# Patient Record
Sex: Female | Born: 1950
Health system: Southern US, Community
[De-identification: ages and names within clinical notes are randomized; demographics above are authoritative.]

## PROBLEM LIST (undated history)

## (undated) DIAGNOSIS — IMO0002 Reserved for concepts with insufficient information to code with codable children: Secondary | ICD-10-CM

## (undated) DIAGNOSIS — Z5189 Encounter for other specified aftercare: Secondary | ICD-10-CM

## (undated) DIAGNOSIS — M8580 Other specified disorders of bone density and structure, unspecified site: Secondary | ICD-10-CM

## (undated) DIAGNOSIS — N814 Uterovaginal prolapse, unspecified: Secondary | ICD-10-CM

## (undated) DIAGNOSIS — N393 Stress incontinence (female) (male): Secondary | ICD-10-CM

## (undated) DIAGNOSIS — R011 Cardiac murmur, unspecified: Secondary | ICD-10-CM

## (undated) DIAGNOSIS — I10 Essential (primary) hypertension: Secondary | ICD-10-CM

## (undated) DIAGNOSIS — N816 Rectocele: Secondary | ICD-10-CM

## (undated) DIAGNOSIS — H269 Unspecified cataract: Secondary | ICD-10-CM

## (undated) DIAGNOSIS — N189 Chronic kidney disease, unspecified: Secondary | ICD-10-CM

## (undated) HISTORY — DX: Chronic kidney disease, unspecified: N18.9

## (undated) HISTORY — DX: Other specified disorders of bone density and structure, unspecified site: M85.80

## (undated) HISTORY — DX: Cardiac murmur, unspecified: R01.1

## (undated) HISTORY — DX: Uterovaginal prolapse, unspecified: N81.4

## (undated) HISTORY — DX: Unspecified cataract: H26.9

## (undated) HISTORY — DX: Reserved for concepts with insufficient information to code with codable children: IMO0002

## (undated) HISTORY — DX: Essential (primary) hypertension: I10

## (undated) HISTORY — DX: Stress incontinence (female) (male): N39.3

## (undated) HISTORY — DX: Encounter for other specified aftercare: Z51.89

## (undated) HISTORY — DX: Rectocele: N81.6

---

## 1991-04-24 HISTORY — PX: VAGINAL HYSTERECTOMY: SUR661

## 1998-05-05 ENCOUNTER — Other Ambulatory Visit: Admission: RE | Admit: 1998-05-05 | Discharge: 1998-05-05 | Payer: Self-pay | Admitting: Obstetrics and Gynecology

## 1999-06-09 ENCOUNTER — Other Ambulatory Visit: Admission: RE | Admit: 1999-06-09 | Discharge: 1999-06-09 | Payer: Self-pay | Admitting: Obstetrics and Gynecology

## 2000-06-18 ENCOUNTER — Other Ambulatory Visit: Admission: RE | Admit: 2000-06-18 | Discharge: 2000-06-18 | Payer: Self-pay | Admitting: Obstetrics and Gynecology

## 2001-11-04 ENCOUNTER — Other Ambulatory Visit: Admission: RE | Admit: 2001-11-04 | Discharge: 2001-11-04 | Payer: Self-pay | Admitting: Obstetrics and Gynecology

## 2003-11-05 ENCOUNTER — Other Ambulatory Visit: Admission: RE | Admit: 2003-11-05 | Discharge: 2003-11-05 | Payer: Self-pay | Admitting: Obstetrics and Gynecology

## 2004-11-24 ENCOUNTER — Other Ambulatory Visit: Admission: RE | Admit: 2004-11-24 | Discharge: 2004-11-24 | Payer: Self-pay | Admitting: Obstetrics and Gynecology

## 2005-03-07 ENCOUNTER — Ambulatory Visit: Payer: Self-pay | Admitting: Internal Medicine

## 2005-11-27 ENCOUNTER — Other Ambulatory Visit: Admission: RE | Admit: 2005-11-27 | Discharge: 2005-11-27 | Payer: Self-pay | Admitting: Obstetrics and Gynecology

## 2006-05-03 ENCOUNTER — Ambulatory Visit: Payer: Self-pay | Admitting: Internal Medicine

## 2006-10-11 ENCOUNTER — Telehealth (INDEPENDENT_AMBULATORY_CARE_PROVIDER_SITE_OTHER): Payer: Self-pay | Admitting: *Deleted

## 2006-12-05 ENCOUNTER — Encounter: Payer: Self-pay | Admitting: Internal Medicine

## 2006-12-05 ENCOUNTER — Other Ambulatory Visit: Admission: RE | Admit: 2006-12-05 | Discharge: 2006-12-05 | Payer: Self-pay | Admitting: Obstetrics and Gynecology

## 2007-04-24 HISTORY — PX: COLONOSCOPY: SHX174

## 2007-06-20 ENCOUNTER — Telehealth (INDEPENDENT_AMBULATORY_CARE_PROVIDER_SITE_OTHER): Payer: Self-pay | Admitting: *Deleted

## 2007-07-28 ENCOUNTER — Telehealth (INDEPENDENT_AMBULATORY_CARE_PROVIDER_SITE_OTHER): Payer: Self-pay | Admitting: *Deleted

## 2007-10-31 ENCOUNTER — Ambulatory Visit: Payer: Self-pay | Admitting: Internal Medicine

## 2007-11-10 ENCOUNTER — Ambulatory Visit: Payer: Self-pay | Admitting: Internal Medicine

## 2007-11-10 ENCOUNTER — Encounter (INDEPENDENT_AMBULATORY_CARE_PROVIDER_SITE_OTHER): Payer: Self-pay | Admitting: *Deleted

## 2007-11-10 DIAGNOSIS — I10 Essential (primary) hypertension: Secondary | ICD-10-CM | POA: Insufficient documentation

## 2007-11-10 DIAGNOSIS — R002 Palpitations: Secondary | ICD-10-CM | POA: Insufficient documentation

## 2007-11-10 LAB — CONVERTED CEMR LAB
ALT: 16 units/L (ref 0–35)
AST: 19 units/L (ref 0–37)
Alkaline Phosphatase: 68 units/L (ref 39–117)
BUN: 21 mg/dL (ref 6–23)
Basophils Absolute: 0 10*3/uL (ref 0.0–0.1)
Basophils Relative: 0.3 % (ref 0.0–1.0)
CO2: 32 meq/L (ref 19–32)
Chloride: 103 meq/L (ref 96–112)
Creatinine, Ser: 1.2 mg/dL (ref 0.4–1.2)
Eosinophils Relative: 3.6 % (ref 0.0–5.0)
LDL Cholesterol: 114 mg/dL — ABNORMAL HIGH (ref 0–99)
Lymphocytes Relative: 42.3 % (ref 12.0–46.0)
Neutrophils Relative %: 46.7 % (ref 43.0–77.0)
Platelets: 228 10*3/uL (ref 150–400)
Potassium: 4.6 meq/L (ref 3.5–5.1)
RBC: 4.02 M/uL (ref 3.87–5.11)
TSH: 2.18 microintl units/mL (ref 0.35–5.50)
Total Bilirubin: 0.7 mg/dL (ref 0.3–1.2)
VLDL: 18 mg/dL (ref 0–40)
WBC: 5.4 10*3/uL (ref 4.5–10.5)

## 2007-11-11 ENCOUNTER — Encounter (INDEPENDENT_AMBULATORY_CARE_PROVIDER_SITE_OTHER): Payer: Self-pay | Admitting: *Deleted

## 2007-11-13 ENCOUNTER — Telehealth (INDEPENDENT_AMBULATORY_CARE_PROVIDER_SITE_OTHER): Payer: Self-pay | Admitting: *Deleted

## 2007-12-01 ENCOUNTER — Encounter: Payer: Self-pay | Admitting: Internal Medicine

## 2007-12-15 ENCOUNTER — Other Ambulatory Visit: Admission: RE | Admit: 2007-12-15 | Discharge: 2007-12-15 | Payer: Self-pay | Admitting: Obstetrics and Gynecology

## 2008-01-05 ENCOUNTER — Ambulatory Visit: Payer: Self-pay | Admitting: Obstetrics and Gynecology

## 2008-05-10 ENCOUNTER — Telehealth (INDEPENDENT_AMBULATORY_CARE_PROVIDER_SITE_OTHER): Payer: Self-pay | Admitting: *Deleted

## 2008-12-29 ENCOUNTER — Other Ambulatory Visit: Admission: RE | Admit: 2008-12-29 | Discharge: 2008-12-29 | Payer: Self-pay | Admitting: Obstetrics and Gynecology

## 2008-12-29 ENCOUNTER — Encounter: Payer: Self-pay | Admitting: Obstetrics and Gynecology

## 2008-12-29 ENCOUNTER — Ambulatory Visit: Payer: Self-pay | Admitting: Obstetrics and Gynecology

## 2009-03-22 ENCOUNTER — Telehealth (INDEPENDENT_AMBULATORY_CARE_PROVIDER_SITE_OTHER): Payer: Self-pay | Admitting: *Deleted

## 2009-03-29 ENCOUNTER — Encounter (INDEPENDENT_AMBULATORY_CARE_PROVIDER_SITE_OTHER): Payer: Self-pay | Admitting: *Deleted

## 2009-05-24 ENCOUNTER — Ambulatory Visit: Payer: Self-pay | Admitting: Internal Medicine

## 2009-05-24 DIAGNOSIS — K219 Gastro-esophageal reflux disease without esophagitis: Secondary | ICD-10-CM

## 2009-06-28 ENCOUNTER — Telehealth (INDEPENDENT_AMBULATORY_CARE_PROVIDER_SITE_OTHER): Payer: Self-pay | Admitting: *Deleted

## 2009-11-07 ENCOUNTER — Ambulatory Visit: Payer: Self-pay | Admitting: Internal Medicine

## 2009-11-07 DIAGNOSIS — R198 Other specified symptoms and signs involving the digestive system and abdomen: Secondary | ICD-10-CM

## 2009-11-18 ENCOUNTER — Telehealth (INDEPENDENT_AMBULATORY_CARE_PROVIDER_SITE_OTHER): Payer: Self-pay | Admitting: *Deleted

## 2009-12-05 ENCOUNTER — Telehealth (INDEPENDENT_AMBULATORY_CARE_PROVIDER_SITE_OTHER): Payer: Self-pay | Admitting: *Deleted

## 2010-01-05 ENCOUNTER — Ambulatory Visit: Payer: Self-pay | Admitting: Obstetrics and Gynecology

## 2010-01-05 ENCOUNTER — Other Ambulatory Visit: Admission: RE | Admit: 2010-01-05 | Discharge: 2010-01-05 | Payer: Self-pay | Admitting: Obstetrics and Gynecology

## 2010-01-10 ENCOUNTER — Telehealth: Payer: Self-pay | Admitting: Internal Medicine

## 2010-05-12 ENCOUNTER — Encounter: Payer: Self-pay | Admitting: Internal Medicine

## 2010-05-23 NOTE — Progress Notes (Signed)
Summary: Referral Order  Phone Note Call from Patient   Caller: Patient Summary of Call: Needs referral to [facility] Dr. Kinnie Scales.  Pt spoke with Bjorn Loser  at Dr. Aurora Mask office and their fax [phone] pt can be contacted at [phone] Initial call taken by: Lavell Islam,  December 05, 2009 11:51 AM  Follow-up for Phone Call        Order placed, Renee (referral Coor will complete) Follow-up by: Shonna Chock CMA,  December 05, 2009 12:01 PM

## 2010-05-23 NOTE — Progress Notes (Signed)
Summary: cymbalta refill   Phone Note Refill Request Call back at Home Phone [phone] Message from:  Patient on June 28, 2009 5:01 PM  was given samples of cymbalta at office visit on 05/2009 was told to call back for prescription   Initial call taken by: Doristine Devoid,  June 28, 2009 5:01 PM    New/Updated Medications: CYMBALTA 30 MG CPEP (DULOXETINE HCL) take one tablet daily Prescriptions: CYMBALTA 30 MG CPEP (DULOXETINE HCL) take one tablet daily  #30 x 2   Entered by:   Doristine Devoid   Authorized by:   Marga Melnick MD   Signed by:   Doristine Devoid on 06/28/2009   Method used:   Electronically to        [facility]. [phone]* (retail)       [address]       [place], [place]  [address]       Ph: [phone]       Fax: [phone]   RxID:   [id number]

## 2010-05-23 NOTE — Progress Notes (Signed)
Summary: FYI  Phone Note From Other Clinic   Caller: Nurse Details for Reason: FYI Summary of Call: Call from [facility] @ Dr. Lady Saucier office, they called to advised that pt cancelled appt for tomorrow. Initial call taken by: Almeta Monas CMA Duncan Dull),  January 10, 2010 8:27 AM  Follow-up for Phone Call        noted Follow-up by: Marga Melnick MD,  January 10, 2010 3:00 PM

## 2010-05-23 NOTE — Assessment & Plan Note (Signed)
Summary: cpx/ns/kdc   Vital Signs:  Patient profile:   60 year old female Height:      64.25 inches Weight:      148.2 pounds BMI:     25.33 Temp:     98.4 degrees F oral Resp:     14 per minute BP sitting:   100 / 66  (left arm) Cuff size:   regular  Vitals Entered By: Shonna Chock (May 24, 2009 3:48 PM)  Comments REVIEWED MED LIST, PATIENT AGREED DOSE AND INSTRUCTION CORRECT    History of Present Illness: Danielle Fischer is here for a physical ; she states she felt better with Citalopram , ie less irritated, but she gained 15#.  Preventive Screening-Counseling & Management  Alcohol-Tobacco     Smoking Status: quit  Caffeine-Diet-Exercise     Does Patient Exercise: yes  Allergies: 1)  ! Erythromycin  Past History:  Past Medical History: Murmur ,aortic valve ; Fibromyalgia  symptoms, controlled with Amitriptyline ; Hypertension ; Palpitations GERD, PMH of  Past Surgical History: G 3 P 3 ; Endoscopy  1994: esophagitis, gastritis; Colonoscopy 2009 negative; Hysterectomy w/o BSO for prolapse  Family History: Father: MI @ 54 (3 ppd smoker), ALS d @ 61; Mother: HTN; Siblings: bro HTN ; son(Sam) Bipolar  Social History: no diet Occupation:Teacher Married Former Smoker: quit age 41 Alcohol use-yes: socially Regular exercise-yes : gym  Review of Systems General:  Denies fatigue, sleep disorder, and weight loss; Weight stable. Eyes:  Denies blurring, double vision, and vision loss-both eyes. ENT:  Denies difficulty swallowing and hoarseness; Stable decreased hearing . CV:  Denies chest pain or discomfort, leg cramps with exertion, lightheadness, near fainting, palpitations, shortness of breath with exertion, swelling of feet, and swelling of hands. Resp:  Denies cough, excessive snoring, hypersomnolence, morning headaches, shortness of breath, and sputum productive. GI:  Denies abdominal pain, bloody stools, change in bowel habits, dark tarry stools, and  indigestion. GU:  Denies discharge, dysuria, and hematuria. MS:  Complains of joint pain; denies joint redness, joint swelling, low back pain, mid back pain, muscle weakness, and thoracic pain; Some knee pain, no Rx. Derm:  Denies changes in nail beds, dryness, hair loss, lesion(s), and rash. Neuro:  Denies numbness and tingling. Psych:  Complains of irritability; denies anxiety, depression, easily angered, and easily tearful; Some hyperfocusing off SSRI. Endo:  Denies cold intolerance, excessive hunger, excessive thirst, excessive urination, and heat intolerance. Heme:  Denies abnormal bruising and bleeding. Allergy:  Denies itching eyes and sneezing.  Physical Exam  General:  well-nourished,in no acute distress; alert,appropriate and cooperative throughout examination Head:  Normocephalic and atraumatic without obvious abnormalities.  Eyes:  No corneal or conjunctival inflammation noted. Perrla. Funduscopic exam benign, without hemorrhages, exudates or papilledema.  Ears:  External ear exam shows no significant lesions or deformities.  Otoscopic examination reveals clear canals, tympanic membranes are intact bilaterally without bulging, retraction, inflammation or discharge. Hearing is grossly normal bilaterally. Nose:  External nasal examination shows no deformity or inflammation. Nasal mucosa are pink and moist without lesions or exudates. Mouth:  Oral mucosa and oropharynx without lesions or exudates.  Teeth in good repair. Neck:  No deformities, masses, or tenderness noted. Lungs:  Normal respiratory effort, chest expands symmetrically. Lungs are clear to auscultation, no crackles or wheezes. Heart:  Normal rate and regular rhythm. S1 and S2 normal without gallop, murmur,  rub . Click suggested @ LSB Abdomen:  Bowel sounds positive,abdomen soft and non-tender without masses, organomegaly or  hernias noted. Genitalia:  Dr Eda Paschal Msk:  No deformity or scoliosis noted of thoracic or  lumbar spine.   Pulses:  R and L carotid,radial,dorsalis pedis and posterior tibial pulses are full and equal bilaterally Extremities:  No clubbing, cyanosis, edema, or deformity noted with normal full range of motion of all joints.   Minor crepitus of knees Neurologic:  alert & oriented X3 and DTRs symmetrical and normal.   Skin:  Intact without suspicious lesions or rashes Cervical Nodes:  No lymphadenopathy noted Axillary Nodes:  No palpable lymphadenopathy Psych:  memory intact for recent and remote, normally interactive, good eye contact, and not anxious appearing.     Impression & Recommendations:  Problem # 1:  PREVENTIVE HEALTH CARE (ICD-V70.0)  Orders: EKG w/ Interpretation (93000)  Problem # 2:  HYPERTENSION (ICD-401.9)  Her updated medication list for this problem includes:    Metoprolol Succinate 100 Mg Xr24h-tab (Metoprolol succinate) .Marland Kitchen... 1/2 by mouth once daily  Orders: EKG w/ Interpretation (93000)  Complete Medication List: 1)  Amitriptyline Hcl 25 Mg Tabs (Amitriptyline hcl) .... Take one tablet at night as needed. 2)  Metoprolol Succinate 100 Mg Xr24h-tab (Metoprolol succinate) .... 1/2 by mouth once daily  Patient Instructions: 1)  Consider fasting labs: 2)  BMP ; 3)  Hepatic Panel ; 4)  Lipid Panel ; 5)  TSH ; 6)  CBC w/ Diff . Consider trial of low dose Cymbalta 30 mg (Note:#28 samples provided) Prescriptions: METOPROLOL SUCCINATE 100 MG XR24H-TAB (METOPROLOL SUCCINATE) 1/2 by mouth once daily  #90 x 1   Entered and Authorized by:   Marga Melnick MD   Signed by:   Marga Melnick MD on 05/24/2009   Method used:   Print then Give to Patient   RxID:   [id number] AMITRIPTYLINE HCL 25 MG TABS (AMITRIPTYLINE HCL) Take one tablet at night as needed.  #90 x 3   Entered and Authorized by:   Marga Melnick MD   Signed by:   Marga Melnick MD on 05/24/2009   Method used:   Print then Give to Patient   RxID:   [id number]

## 2010-05-23 NOTE — Assessment & Plan Note (Signed)
Summary: diarhhea/kn   Vital Signs:  Patient profile:   60 year old female Weight:      149.2 pounds BMI:     25.50 Temp:     98.9 degrees F oral Pulse rate:   64 / minute Resp:     15 per minute BP sitting:   118 / 72  (left arm) Cuff size:   large  Vitals Entered By: Shonna Chock CMA (November 07, 2009 11:59 AM) CC: Diarrhea for too long   CC:  Diarrhea for too long.  History of Present Illness: Stool has been mainly loose for weeks,occasionally( every 4 days) frank diarrhea.Stress is trigger ; this  has been recurrent pattern. Recent family stresses. Rx: Prilosec OTC X 4 days   & Align w/o benefit.PMH of gastritis  & esophagitis in 1994.  Allergies: 1)  ! Erythromycin  Review of Systems General:  Denies chills, fever, sweats, and weight loss. GI:  Complains of gas; denies abdominal pain, bloody stools, dark tarry stools, excessive appetite, indigestion, loss of appetite, nausea, and vomiting.  Physical Exam  General:  well-nourished,in no acute distress; alert,appropriate and cooperative throughout examination Eyes:  No corneal or conjunctival inflammation noted.No icterus Mouth:  Oral mucosa and oropharynx without lesions or exudates.  Teeth in good repair. No pharyngeal erythema.   Heart:  Normal rate and regular rhythm. S1 and S2 normal without gallop, murmur, click, rub .S4 Abdomen:  Bowel sounds positive,abdomen soft and non-tender without masses, organomegaly or hernias noted. Aorta palpable Skin:  Intact without suspicious lesions or rashes. No jaundice Cervical Nodes:  No lymphadenopathy noted Axillary Nodes:  No palpable lymphadenopathy Psych:  memory intact for recent and remote, normally interactive, and good eye contact.     Impression & Recommendations:  Problem # 1:  CHANGE IN BOWELS (JXB-147.82)  Problem # 2:  GERD (ICD-530.81)  Her updated medication list for this problem includes:    Hyoscyamine Sulfate 0.125 Mg Subl (Hyoscyamine sulfate) .Marland Kitchen... 1  sublingually every 6-8 hrs as needed  Complete Medication List: 1)  Amitriptyline Hcl 25 Mg Tabs (Amitriptyline hcl) .... Take one tablet at night as needed. 2)  Metoprolol Succinate 100 Mg Xr24h-tab (Metoprolol succinate) .... 1/2 by mouth once daily 3)  Cymbalta 30 Mg Cpep (Duloxetine hcl) .... Take one tablet daily 4)  Hyoscyamine Sulfate 0.125 Mg Subl (Hyoscyamine sulfate) .Marland Kitchen.. 1 sublingually every 6-8 hrs as needed  Patient Instructions: 1)  Avoid foods high in acid (tomatoes, citrus juices, spicy foods). Avoid eating within two hours of lying down or before exercising. Do not over eat; try smaller more frequent meals. Elevate head of bed twelve inches when sleeping. Trial of Prilosec OTC  two times a day pre meals  Prescriptions: HYOSCYAMINE SULFATE 0.125 MG SUBL (HYOSCYAMINE SULFATE) 1 sublingually every 6-8 hrs as needed  #30 x 0   Entered and Authorized by:   Marga Melnick MD   Signed by:   Marga Melnick MD on 11/07/2009   Method used:   Print then Give to Patient   RxID:   [id number]

## 2010-05-23 NOTE — Progress Notes (Signed)
Summary: diarrhea no better  Phone Note Outgoing Call Call back at [facility] Phone [phone]   Call placed by: [facility] CMA,  November 18, 2009 10:52 AM Summary of Call: pt states  she was seen last week for diarrhea. pt states that she has tried all the med dr hopper gave but still has no relief. Pt would like to know what to do now. pls advise..................Marland KitchenFelecia Deloach CMA  November 18, 2009 10:55 AM   Follow-up for Phone Call        Generic Lomotil 2.5 mg as needed for frank watery BMs #10. GI consult if no better. Keep Diary of possible food triggers especially wheat & dairy Follow-up by: Marga Melnick MD,  November 18, 2009 1:47 PM    Additional Follow-up for Phone Call Additional follow up Details #2:: Follow-up by: Marga Melnick MD,  November 18, 2009 4:55 PM  Additional Follow-up for Phone Call Additional follow up Details #3:: Details for Additional Follow-up Action Taken: Left message on machine with Dr.Hopper's instruction and informing patient rx sent to pharmacy on file. Patient to call on Monday if any questions or concerns./Chrae [facility] CMA  November 18, 2009 5:03 PM   New/Updated Medications: * LOMOTIL 2.5 MG as needed for frank watery BMs Prescriptions: LOMOTIL 2.5 MG as needed for frank watery BMs  #10 x 0   Entered by:   Shonna Chock CMA   Authorized by:   Marga Melnick MD   Signed by:   Shonna Chock CMA on 11/18/2009   Method used:   Faxed to ...       [facility] [address] [phone]* (retail)       [address]       [place], [place]  [address]       Ph: [phone]       Fax: [phone]   RxID:   [id number]

## 2010-07-11 ENCOUNTER — Other Ambulatory Visit: Payer: Self-pay | Admitting: Internal Medicine

## 2010-09-13 ENCOUNTER — Other Ambulatory Visit: Payer: Self-pay | Admitting: Internal Medicine

## 2010-09-13 NOTE — Telephone Encounter (Signed)
Patient needs to schedule  CPX  

## 2010-12-16 ENCOUNTER — Other Ambulatory Visit: Payer: Self-pay | Admitting: Internal Medicine

## 2010-12-18 NOTE — Telephone Encounter (Signed)
Patient needs to schedule a CPX  

## 2011-01-05 ENCOUNTER — Encounter: Payer: Self-pay | Admitting: Gynecology

## 2011-01-05 DIAGNOSIS — N814 Uterovaginal prolapse, unspecified: Secondary | ICD-10-CM | POA: Insufficient documentation

## 2011-01-05 DIAGNOSIS — M8580 Other specified disorders of bone density and structure, unspecified site: Secondary | ICD-10-CM | POA: Insufficient documentation

## 2011-01-05 DIAGNOSIS — IMO0002 Reserved for concepts with insufficient information to code with codable children: Secondary | ICD-10-CM | POA: Insufficient documentation

## 2011-01-05 DIAGNOSIS — N393 Stress incontinence (female) (male): Secondary | ICD-10-CM | POA: Insufficient documentation

## 2011-01-05 DIAGNOSIS — N816 Rectocele: Secondary | ICD-10-CM | POA: Insufficient documentation

## 2011-01-11 ENCOUNTER — Other Ambulatory Visit: Payer: Self-pay | Admitting: Internal Medicine

## 2011-01-12 NOTE — Telephone Encounter (Signed)
Last seen 10/2009 I called patient and left message on her voicemail informing her she needs to call and schedule a CPX   Dr.Hopper please respond on 90day supply request for rx

## 2011-01-18 ENCOUNTER — Encounter (INDEPENDENT_AMBULATORY_CARE_PROVIDER_SITE_OTHER): Payer: Self-pay | Admitting: Obstetrics and Gynecology

## 2011-01-18 DIAGNOSIS — Z01419 Encounter for gynecological examination (general) (routine) without abnormal findings: Secondary | ICD-10-CM

## 2011-01-18 NOTE — Progress Notes (Signed)
Pt. Was accidentally arrived in the computer but she was actually a no show

## 2011-01-19 NOTE — Progress Notes (Signed)
Patient never came for appoinment. Our office will calll her.

## 2011-01-22 ENCOUNTER — Other Ambulatory Visit: Payer: Self-pay | Admitting: Internal Medicine

## 2011-02-21 NOTE — Telephone Encounter (Signed)
Done

## 2011-02-21 NOTE — Telephone Encounter (Signed)
OK X1 

## 2011-04-11 ENCOUNTER — Telehealth: Payer: Self-pay | Admitting: Internal Medicine

## 2011-04-11 NOTE — Telephone Encounter (Signed)
Patient had cpx scheduled 04-13-11 but she is cancelling & wants to just have a late appt for bp check - her last cpx was 05-24-09 - i offered an appt for cpx 8:30 & as late as 2:30 she said she needs later appt - is it ok to schedule bp check

## 2011-04-12 NOTE — Telephone Encounter (Signed)
Yes this needs to be an appointment with the doctor (not a nurse visit)

## 2011-04-12 NOTE — Telephone Encounter (Signed)
lmom for patient to schedule bp check with md

## 2011-04-13 ENCOUNTER — Encounter: Payer: Self-pay | Admitting: Internal Medicine

## 2011-04-19 NOTE — Telephone Encounter (Signed)
Patient returned call - appt scheduled [phone]

## 2011-04-25 ENCOUNTER — Encounter: Payer: Self-pay | Admitting: Internal Medicine

## 2011-04-25 ENCOUNTER — Ambulatory Visit (INDEPENDENT_AMBULATORY_CARE_PROVIDER_SITE_OTHER): Payer: BC Managed Care – PPO | Admitting: Internal Medicine

## 2011-04-25 DIAGNOSIS — E785 Hyperlipidemia, unspecified: Secondary | ICD-10-CM

## 2011-04-25 DIAGNOSIS — Z8249 Family history of ischemic heart disease and other diseases of the circulatory system: Secondary | ICD-10-CM

## 2011-04-25 DIAGNOSIS — M8580 Other specified disorders of bone density and structure, unspecified site: Secondary | ICD-10-CM

## 2011-04-25 DIAGNOSIS — I10 Essential (primary) hypertension: Secondary | ICD-10-CM

## 2011-04-25 DIAGNOSIS — M899 Disorder of bone, unspecified: Secondary | ICD-10-CM

## 2011-04-25 DIAGNOSIS — K219 Gastro-esophageal reflux disease without esophagitis: Secondary | ICD-10-CM

## 2011-04-25 LAB — CBC WITH DIFFERENTIAL/PLATELET
Basophils Relative: 0.7 % (ref 0.0–3.0)
Eosinophils Relative: 2.6 % (ref 0.0–5.0)
HCT: 39 % (ref 36.0–46.0)
Lymphs Abs: 1.7 10*3/uL (ref 0.7–4.0)
MCV: 94.8 fl (ref 78.0–100.0)
Monocytes Absolute: 0.4 10*3/uL (ref 0.1–1.0)
Monocytes Relative: 8.4 % (ref 3.0–12.0)
RBC: 4.12 Mil/uL (ref 3.87–5.11)
WBC: 4.2 10*3/uL — ABNORMAL LOW (ref 4.5–10.5)

## 2011-04-25 LAB — BASIC METABOLIC PANEL
BUN: 19 mg/dL (ref 6–23)
CO2: 31 mEq/L (ref 19–32)
Calcium: 9.6 mg/dL (ref 8.4–10.5)
Chloride: 106 mEq/L (ref 96–112)
Creatinine, Ser: 1.2 mg/dL (ref 0.4–1.2)
Glucose, Bld: 112 mg/dL — ABNORMAL HIGH (ref 70–99)

## 2011-04-25 LAB — LIPID PANEL
Cholesterol: 217 mg/dL — ABNORMAL HIGH (ref 0–200)
HDL: 79.5 mg/dL (ref >39.00)
Triglycerides: 143 mg/dL (ref 0.0–149.0)

## 2011-04-25 LAB — HEPATIC FUNCTION PANEL
ALT: 16 U/L (ref 0–35)
Albumin: 4.2 g/dL (ref 3.5–5.2)
Total Protein: 7.3 g/dL (ref 6.0–8.3)

## 2011-04-25 LAB — TSH: TSH: 1.67 u[IU]/mL (ref 0.35–5.50)

## 2011-04-25 NOTE — Patient Instructions (Addendum)
Blood Pressure Goal  Ideally is an AVERAGE < 135/85. This AVERAGE should be calculated from @ least 5-7 BP readings taken @ different times of day on different days of week. You should not respond to isolated BP readings , but rather the AVERAGE for that week  Preventive Health Care: Exercise  30-45  minutes a day, 3-4 days a week. Walking is especially valuable in preventing Osteoporosis. Eat a low-fat diet with lots of fruits and vegetables, up to 7-9 servings per day. Consume less than 30 grams of sugar per day from foods & drinks with High Fructose Corn Syrup as #1, 2,3 or #4 on label. Health Care Power of Attorney & Living Will place you in charge of your health care  decisions. Verify these are  in place. Please complete stool cards   As per the Standard of Care , screening Colonoscopy recommended @ 50 & every 5-10 years thereafter . More frequent monitor would be dictated by family history or findings @ Colonoscopy

## 2011-04-25 NOTE — Progress Notes (Signed)
Addended byPecola Lawless on: 04/25/2011 09:02 AM   Modules accepted: Orders

## 2011-04-25 NOTE — Progress Notes (Signed)
  Subjective:    Patient ID: Danielle Fischer, female    DOB: 1950-06-05, 61 y.o.   MRN: [id number]  HPI CHRONIC HYPERTENSION: Disease Monitoring  Blood pressure range: not monitored  Chest pain: no   Dyspnea: no   Claudication: no               Palpitations : no Medication compliance: yes, off it by mistake for 24 hours when out of town over holidays Medication Side Effects  Lightheadedness: no   Urinary frequency: no   Edema: no   Preventitive Healthcare:  Exercise: no   Diet Pattern: no  Salt Restriction: no     Review of Systems She has no active symptoms. Her gynecologist has been performing lab studies and monitoring her osteopenia. She has not had a colonoscopy for 10 years. Specifically she denies any abdominal pain, significant weight loss, rectal bleeding, or melena.        Objective:   Physical Exam Gen.: Healthy and well-nourished in appearance. Alert, appropriate and cooperative throughout exam. Eyes: No corneal or conjunctival inflammation noted. Pupils equal round reactive to light and accommodation. Fundal exam is benign without hemorrhages, exudate, papilledema. Neck: No deformities, masses, or tenderness noted.  Thyroid normal. Lungs: Normal respiratory effort; chest expands symmetrically. Lungs are clear to auscultation without rales, wheezes, or increased work of breathing. Heart: Normal rate and rhythm. Normal S1 and S2. No gallop, click, or rub. Grade 1/2  Systolic  murmur. Abdomen: Bowel sounds normal; abdomen soft and nontender. No masses, organomegaly or hernias noted. The aorta is palpable; there is no enlargement or aneurysm. Genitalia: Dr Eda Paschal   .                                                                                   Musculoskeletal/extremities:  No clubbing, cyanosis, edema, or deformity noted. Nail health  good. Vascular: Carotid, radial artery, dorsalis pedis and  posterior tibial pulses are full and equal. No bruits  present. Neurologic: Alert and oriented x3. Deep tendon reflexes symmetrical and normal.          Skin: Intact without suspicious lesions or rashes. Lymph: No cervical, axillary lymphadenopathy present. Psych: Mood and affect are normal. Normally interactive                                                                                         Assessment & Plan:

## 2011-04-25 NOTE — Assessment & Plan Note (Addendum)
Vitamin D level will be checked; she will take results to Dr Eda Paschal

## 2011-04-25 NOTE — Assessment & Plan Note (Signed)
She is asymptomatic. Stool cards will be collected. She will check on when  her colonoscopy is due.

## 2011-04-25 NOTE — Assessment & Plan Note (Addendum)
Blood pressure is well-controlled; there were no physical findings related to hypertension. Her father had myocardial infarction in his 97s but was a multiple pack per day smoker. EKG is normal

## 2011-04-26 ENCOUNTER — Other Ambulatory Visit: Payer: Self-pay | Admitting: Internal Medicine

## 2011-04-27 LAB — HEMOGLOBIN A1C: Hgb A1c MFr Bld: 5.6 % (ref 4.6–6.5)

## 2011-05-11 ENCOUNTER — Encounter: Payer: Self-pay | Admitting: Obstetrics and Gynecology

## 2011-05-11 ENCOUNTER — Ambulatory Visit (INDEPENDENT_AMBULATORY_CARE_PROVIDER_SITE_OTHER): Payer: BC Managed Care – PPO | Admitting: Obstetrics and Gynecology

## 2011-05-11 VITALS — BP 124/76 | Ht 64.0 in | Wt 150.0 lb

## 2011-05-11 DIAGNOSIS — M8580 Other specified disorders of bone density and structure, unspecified site: Secondary | ICD-10-CM

## 2011-05-11 DIAGNOSIS — M899 Disorder of bone, unspecified: Secondary | ICD-10-CM

## 2011-05-11 DIAGNOSIS — Z01419 Encounter for gynecological examination (general) (routine) without abnormal findings: Secondary | ICD-10-CM

## 2011-05-11 LAB — URINALYSIS W MICROSCOPIC + REFLEX CULTURE
Bilirubin Urine: NEGATIVE
Ketones, ur: NEGATIVE mg/dL
Nitrite: NEGATIVE
Specific Gravity, Urine: 1.015 (ref 1.005–1.030)
Urobilinogen, UA: 0.2 mg/dL (ref 0.0–1.0)
pH: 5.5 (ref 5.0–8.0)

## 2011-05-11 NOTE — Progress Notes (Signed)
Patient came to see me today for her annual GYN exam. She has no complaints. She has no significant menopausal symptoms. She continues with yearly mammograms. She is due for followup bone density. She has osteopenia without an elevated fracture risk. She has had no fractures. She takes calcium and vitamin D. She is having no vaginal bleeding. She is having no pelvic pain. She does her lab through her PCP.  HEENT: Within normal limits. Kennon Portela present. Neck: No masses. Supraclavicular lymph nodes: Not enlarged. Breasts: Examined in both sitting and lying position. Symmetrical without skin changes or masses. Abdomen: Soft no masses guarding or rebound. No hernias. Pelvic: External within normal limits. BUS within normal limits. Vaginal examination shows good estrogen effect, no cystocele enterocele or rectocele. Cervix and uterus absent. Adnexa within normal limits. Rectovaginal confirmatory. Extremities within normal limits.  Assessment: Normal GYN exam. Osteopenia.  Plan: Continue yearly mammograms. Scheduled bone density.

## 2011-06-24 ENCOUNTER — Other Ambulatory Visit: Payer: Self-pay | Admitting: Internal Medicine

## 2011-06-25 NOTE — Telephone Encounter (Signed)
Prescription sent to pharmacy.

## 2011-07-28 ENCOUNTER — Other Ambulatory Visit: Payer: Self-pay | Admitting: Internal Medicine

## 2011-10-26 ENCOUNTER — Other Ambulatory Visit: Payer: Self-pay | Admitting: Internal Medicine

## 2012-02-06 ENCOUNTER — Other Ambulatory Visit: Payer: Self-pay | Admitting: Internal Medicine

## 2012-02-06 MED ORDER — AMITRIPTYLINE HCL 25 MG PO TABS: ORAL_TABLET | ORAL | Status: DC

## 2012-02-06 NOTE — Telephone Encounter (Signed)
Rx sent 

## 2012-02-06 NOTE — Telephone Encounter (Signed)
Refill Amitriptyline 25mg  tablets #90 take 1 tablet by mouth eat bedtime as needed only -- last fill 7.16.13--last OV 1.2.13 f/u BP

## 2012-03-04 ENCOUNTER — Encounter: Payer: Self-pay | Admitting: Obstetrics and Gynecology

## 2012-05-04 ENCOUNTER — Other Ambulatory Visit: Payer: Self-pay | Admitting: Internal Medicine

## 2012-05-05 ENCOUNTER — Other Ambulatory Visit: Payer: Self-pay | Admitting: Internal Medicine

## 2012-05-05 NOTE — Telephone Encounter (Signed)
Patient needs to schedule a CPX  

## 2012-05-06 NOTE — Telephone Encounter (Signed)
#  60 appropriate

## 2012-05-06 NOTE — Telephone Encounter (Signed)
Last OV 04/25/2011 (greater than 12 months ago) med approved yesterday for #60, 0 refills with indication OV DUE.  Patient is requesting #90 vs #60, no pending appointment scheduled   Hopp please advise

## 2012-05-06 NOTE — Telephone Encounter (Signed)
Note sent to pharmacy with MD's indication

## 2012-09-08 ENCOUNTER — Ambulatory Visit (INDEPENDENT_AMBULATORY_CARE_PROVIDER_SITE_OTHER): Payer: BC Managed Care – PPO | Admitting: Internal Medicine

## 2012-09-08 ENCOUNTER — Encounter: Payer: Self-pay | Admitting: Internal Medicine

## 2012-09-08 VITALS — BP 122/78 | HR 78 | Wt 153.0 lb

## 2012-09-08 DIAGNOSIS — R5381 Other malaise: Secondary | ICD-10-CM

## 2012-09-08 DIAGNOSIS — F39 Unspecified mood [affective] disorder: Secondary | ICD-10-CM

## 2012-09-08 DIAGNOSIS — R5383 Other fatigue: Secondary | ICD-10-CM

## 2012-09-08 DIAGNOSIS — I10 Essential (primary) hypertension: Secondary | ICD-10-CM

## 2012-09-08 DIAGNOSIS — F338 Other recurrent depressive disorders: Secondary | ICD-10-CM

## 2012-09-08 MED ORDER — METOPROLOL SUCCINATE ER 100 MG PO TB24: ORAL_TABLET | ORAL | Status: DC

## 2012-09-08 NOTE — Patient Instructions (Addendum)
Minimal Blood Pressure Goal= AVERAGE < 140/90;  Ideal is an AVERAGE < 135/85. This AVERAGE should be calculated from @ least 5-7 BP readings taken @ different times of day on different days of week. You should not respond to isolated BP readings , but rather the AVERAGE for that week .Please bring your  blood pressure cuff to office visits to verify that it is reliable.It  can also be checked against the blood pressure device at the pharmacy. Finger or wrist cuffs are not dependable; an arm cuff is. Please  schedule fasting Labs : BMET,Lipids, hepatic panel, CBC & dif, TSH.PLEASE BRING THESE INSTRUCTIONS TO FOLLOW UP  LAB APPOINTMENT.This will guarantee correct labs are drawn, eliminating need for repeat blood sampling ( needle sticks ! ). Diagnoses /Codes: 401.9, 780.79.  If you activate the  My Chart system; lab & Xray results will be released directly  to you as soon as I review & address these through the computer. If you choose not to sign up for My Chart within 36 hours of labs being drawn; results will be reviewed & interpretation added before being copied & mailed, causing a delay in getting the results to you.If you do not receive that report within 7-10 days ,please call. Additionally you can use this system to gain direct  access to your records  if  out of town or @ an office of a  physician who is not in  the My Chart network.  This improves continuity of care & places you in control of your medical record.

## 2012-09-08 NOTE — Progress Notes (Signed)
  Subjective:    Patient ID: Danielle Fischer, female    DOB: [birth year], 62 y.o.   MRN: [id number]  HPI CHRONIC HYPERTENSION follow-up:  Home blood pressure  not monitored, but "fine " @ physician's offices  Patient is compliant with medications  No definite adverse effects noted from medication ; but she feels tired  No exercise program   No specific dietary program ; not on low-salt diet           Review of Systems  No chest pain, palpitations, dyspnea, claudication,edema or paroxysmal nocturnal dyspnea described. No significant lightheadedness, headache, epistaxis, or syncope.  Her fatigue is in the context of increased stress related to her son's mental health issues. 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 The fatigue has improved to some extent. The fatigue has not been associated with blurred vision, double vision, loss of vision, hoarseness, difficulty swallowing, temperature intolerance, change in hair/nails/skin. She also denies unexplained weight loss, melena, rectal bleeding. There's no significant constipation or diarrhea. She is due for colonoscopy; she plans to schedule it this summer after she gets out of school.  She states that the fatigue seems to be worse seasonally, particularly in the Winter. Symptoms always improve in the Summer.  She has had some dry eyes; her ophthalmologist questions whether this is related to rosacea.      Objective:   Physical Exam Appears healthy and well-nourished & in no acute distress  Extraocular motion is intact. She has no lid lag or proptosis  Thyroid is normal to palpation with no nodularity or enlargement  No carotid bruits are present.No neck pain distention present at 10 - 15 degrees.   Heart rhythm and rate are normal with no significant murmurs or gallops.  Chest is clear with no increased work of breathing  There is no evidence of aortic aneurysm or renal artery bruits  Abdomen soft with no organomegaly  or masses. No HJR. Aorta is palpable; no aneurysm is present  No clubbing, cyanosis or edema present.  Pedal pulses are intact   No ischemic skin changes are present . Nails healthy ;no onycholysis  Alert and oriented. Strength, tone, DTRs reflexes normal. No tremor.          Assessment & Plan:  #1 hypertension, well-controlled  #2 fatigue, multifactorial. Seasonal affective disorder may be playing a component  Plan: No change in present medications. Fasting labs recommended

## 2012-09-22 ENCOUNTER — Other Ambulatory Visit: Payer: Self-pay | Admitting: *Deleted

## 2012-09-22 MED ORDER — AMITRIPTYLINE HCL 25 MG PO TABS: ORAL_TABLET | ORAL | Status: DC

## 2012-09-22 NOTE — Telephone Encounter (Signed)
Rx sent 

## 2012-09-26 ENCOUNTER — Other Ambulatory Visit: Payer: Self-pay | Admitting: General Practice

## 2012-09-26 MED ORDER — AMITRIPTYLINE HCL 25 MG PO TABS: ORAL_TABLET | ORAL | Status: DC

## 2012-09-26 NOTE — Addendum Note (Signed)
Addended by: Candie Echevaria L on: 09/26/2012 10:42 AM   Modules accepted: Orders, Medications

## 2012-10-21 ENCOUNTER — Encounter: Payer: BC Managed Care – PPO | Admitting: Internal Medicine

## 2012-12-08 ENCOUNTER — Telehealth: Payer: Self-pay | Admitting: Internal Medicine

## 2012-12-08 NOTE — Telephone Encounter (Signed)
Patient was last seen on 09/08/12 and says that she was going to go to her gynecologist to have her labs done, however, she never went and her GYN has now retired. Patient would like to come back in to have labs done. Needs orders in system. Patient Instructions: BMET,Lipids, hepatic panel, CBC & dif, TSH. Diagnoses /Codes: 401.9, 780.79.

## 2012-12-09 NOTE — Telephone Encounter (Addendum)
LM @ (9:57am) asking the pt to RTC regarding scheduling an appt for lab work.//AB/CMA

## 2012-12-23 ENCOUNTER — Other Ambulatory Visit: Payer: Self-pay | Admitting: *Deleted

## 2012-12-23 ENCOUNTER — Other Ambulatory Visit: Payer: Self-pay | Admitting: Internal Medicine

## 2012-12-23 MED ORDER — AMITRIPTYLINE HCL 25 MG PO TABS: 25.0000 mg | ORAL_TABLET | Freq: Every day | ORAL | Status: DC

## 2012-12-23 NOTE — Telephone Encounter (Signed)
Rx was refilled for amitriptyline.  Ag cma

## 2012-12-24 NOTE — Telephone Encounter (Signed)
Med filled.  

## 2013-04-14 ENCOUNTER — Other Ambulatory Visit: Payer: Self-pay | Admitting: *Deleted

## 2013-04-14 ENCOUNTER — Telehealth: Payer: Self-pay | Admitting: *Deleted

## 2013-04-14 MED ORDER — ACYCLOVIR 200 MG PO CAPS: 200.0000 mg | ORAL_CAPSULE | Freq: Every day | ORAL | Status: DC

## 2013-04-14 NOTE — Telephone Encounter (Signed)
Please advise 

## 2013-04-14 NOTE — Telephone Encounter (Signed)
Acyclovir 200 mg 5 times a day dispense 5, refill x2

## 2013-04-14 NOTE — Telephone Encounter (Signed)
Acyclovir e-scribed to pharmacy. JG//CMA

## 2013-04-14 NOTE — Telephone Encounter (Signed)
Patient called and wanted to see if dr hopper would write her a rx for fever blisters around her lips. She states that she has been getting them for 5 years.   Pharmacy [id number] - [place], [place] - [address] AT [facility] [address]

## 2013-04-30 ENCOUNTER — Other Ambulatory Visit: Payer: Self-pay | Admitting: Internal Medicine

## 2013-05-01 NOTE — Telephone Encounter (Signed)
OK X1, R X 2 

## 2013-05-01 NOTE — Telephone Encounter (Signed)
Requesting Amitriptyline 25mg   Last refill:01-28-13 Last OV:09-08-12 Please advise://AB/CMA

## 2013-08-06 ENCOUNTER — Other Ambulatory Visit: Payer: Self-pay | Admitting: Internal Medicine

## 2013-08-06 NOTE — Telephone Encounter (Signed)
Last ov 09/08/12 Med last filled by you 04/30/13 #90 no refills

## 2013-08-10 ENCOUNTER — Other Ambulatory Visit: Payer: Self-pay | Admitting: Internal Medicine

## 2013-08-10 ENCOUNTER — Ambulatory Visit (INDEPENDENT_AMBULATORY_CARE_PROVIDER_SITE_OTHER): Payer: BC Managed Care – PPO | Admitting: Internal Medicine

## 2013-08-10 VITALS — BP 110/84 | HR 63 | Temp 97.7°F | Resp 19 | Ht 64.0 in | Wt 156.4 lb

## 2013-08-10 DIAGNOSIS — M542 Cervicalgia: Secondary | ICD-10-CM

## 2013-08-10 DIAGNOSIS — M62838 Other muscle spasm: Secondary | ICD-10-CM

## 2013-08-10 MED ORDER — MELOXICAM 15 MG PO TABS: 15.0000 mg | ORAL_TABLET | Freq: Every day | ORAL | Status: DC

## 2013-08-10 MED ORDER — CYCLOBENZAPRINE HCL 5 MG PO TABS: 5.0000 mg | ORAL_TABLET | Freq: Three times a day (TID) | ORAL | Status: DC | PRN

## 2013-08-10 NOTE — Patient Instructions (Signed)
Take the meloxicam and the flexeril as directed. Use warm moist compresses to the bneck area.If your symptoms worsen return to the office for re evaluation.

## 2013-08-10 NOTE — Progress Notes (Signed)
   Subjective:    Patient ID: Danielle Fischer, female    DOB: Aug 25, 1950, 63 y.o.   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 Pain is in upper laterla sides of neck increased with motion aching not sharp no numbness or tingling of extrems no lsos of strenght, no direct or indirect trauma to the area. No difficulty walking no loss of control of bowel or bladder. Has tried ibuprofen and tyleno with some relied but she feels like the muscles are tightening up as the day goes on.   Review of Systems  Musculoskeletal: Positive for arthralgias, myalgias and neck pain.       Objective:   Physical Exam  Nursing note and vitals reviewed. Constitutional: She is oriented to person, place, and time. She appears well-developed and well-nourished.  HENT:  Head: Normocephalic and atraumatic.  Nose: Nose normal.  Mouth/Throat: Oropharynx is clear and moist.  Eyes: Conjunctivae and EOM are normal. Pupils are equal, round, and reactive to light.  Neck: Normal range of motion. Neck supple.  Tender with spasm to the laterla cervical musculature and traps. Pain on rom no midline pain.  Cardiovascular: Normal rate, regular rhythm and normal heart sounds.   Pulmonary/Chest: Effort normal and breath sounds normal.  Abdominal: Soft.  Musculoskeletal: Normal range of motion.  Neurological: She is alert and oriented to person, place, and time.  Skin: Skin is warm and dry.  Psychiatric: She has a normal mood and affect. Her behavior is normal. Judgment and thought content normal.          Assessment & Plan:  Muscle spasm Neck pain Will rx nasal long acting and muscle relaxant. Will recommend warm moist compresses and stretching exercises

## 2013-08-19 NOTE — Addendum Note (Signed)
Addended by: Wardell Honour on: 08/19/2013 01:11 PM   Modules accepted: Level of Service

## 2013-09-22 ENCOUNTER — Ambulatory Visit (INDEPENDENT_AMBULATORY_CARE_PROVIDER_SITE_OTHER): Payer: BC Managed Care – PPO | Admitting: Internal Medicine

## 2013-09-22 ENCOUNTER — Other Ambulatory Visit: Payer: Self-pay | Admitting: Internal Medicine

## 2013-09-22 VITALS — BP 160/90 | HR 87 | Temp 98.7°F | Ht 64.0 in | Wt 152.0 lb

## 2013-09-22 DIAGNOSIS — W57XXXA Bitten or stung by nonvenomous insect and other nonvenomous arthropods, initial encounter: Secondary | ICD-10-CM

## 2013-09-22 DIAGNOSIS — L259 Unspecified contact dermatitis, unspecified cause: Secondary | ICD-10-CM

## 2013-09-22 DIAGNOSIS — T148 Other injury of unspecified body region: Secondary | ICD-10-CM

## 2013-09-22 MED ORDER — CLOBETASOL PROPIONATE 0.05 % EX CREA: 1.0000 "application " | TOPICAL_CREAM | Freq: Two times a day (BID) | CUTANEOUS | Status: DC

## 2013-09-22 NOTE — Progress Notes (Signed)
   Subjective:    Patient ID: Danielle Fischer, female    DOB: [birth year], 63 y.o.   MRN: [id number]  HPI Patient was working in her bushes in her yard 3 days ago and noticed a discolored area on her left forearm, it initially looked like a spot of dirt. It has since gotten very dark with an area of redness. The center of the lesion has gotten slightly lighter in color. She noticed a dark area on her left upper, outer leg this evening that looks similar to her left forearm. She never saw an insect or experienced pain.   Left upper arm began itching today and she noticed it was red and swollen. She has been scratching it all day.  Patient did not take blood pressure medicine last night, was out and was on her way home from the drugstore with her refill.   Review of Systems No fever or chills, no headache.     Objective:   Physical Exam  Vitals reviewed. Constitutional: She is oriented to person, place, and time. She appears well-developed and well-nourished.  HENT:  Head: Normocephalic.  Eyes: Conjunctivae are normal. Right eye exhibits no discharge. Left eye exhibits no discharge.  Neck: Normal range of motion. Neck supple.  Pulmonary/Chest: Effort normal.  Musculoskeletal: Normal range of motion.  Neurological: She is alert and oriented to person, place, and time.  Skin: Skin is warm and dry.     Psychiatric: She has a normal mood and affect. Her behavior is normal. Judgment and thought content normal.       Assessment & Plan:  1. Contact dermatitis - clobetasol cream (TEMOVATE) 0.05 %; Apply 1 application topically 2 (two) times daily.  Dispense: 30 g; Refill: 0  2. Bug bite without infection -patient provided with written and verbal instructions- Patient Instructions  Keep areas of irritation clean and dry Return to clinic if any worsening/enlarging of affected area, fever/chills, nausea/vomiting, severe headache Apply clobetasol twice a day (sparingly) to affected area of  upper arm until rash resolved. If not resolved in 10 days please return.   Elby Beck, FNP-BC  [facility]  09/22/2013 9:55 PM I have examined the patient, reviewed and agree with documentation/plan. Robert P. Laney Pastor, M.D.

## 2013-09-22 NOTE — Patient Instructions (Signed)
Keep areas of irritation clean and dry Return to clinic if any worsening/enlarging of affected area, fever/chills, nausea/vomiting, severe headache Apply clobetasol twice a day (sparingly) to affected area of upper arm until rash resolved. If not resolved in 10 days please return.  Contact Dermatitis Contact dermatitis is a reaction to certain substances that touch the skin. Contact dermatitis can be either irritant contact dermatitis or allergic contact dermatitis. Irritant contact dermatitis does not require previous exposure to the substance for a reaction to occur.Allergic contact dermatitis only occurs if you have been exposed to the substance before. Upon a repeat exposure, your body reacts to the substance.  CAUSES  Many substances can cause contact dermatitis. Irritant dermatitis is most commonly caused by repeated exposure to mildly irritating substances, such as:  Makeup.  Soaps.  Detergents.  Bleaches.  Acids.  Metal salts, such as nickel. Allergic contact dermatitis is most commonly caused by exposure to:  Poisonous plants.  Chemicals (deodorants, shampoos).  Jewelry.  Latex.  Neomycin in triple antibiotic cream.  Preservatives in products, including clothing. SYMPTOMS  The area of skin that is exposed may develop:  Dryness or flaking.  Redness.  Cracks.  Itching.  Pain or a burning sensation.  Blisters. With allergic contact dermatitis, there may also be swelling in areas such as the eyelids, mouth, or genitals.  DIAGNOSIS  Your caregiver can usually tell what the problem is by doing a physical exam. In cases where the cause is uncertain and an allergic contact dermatitis is suspected, a patch skin test may be performed to help determine the cause of your dermatitis. TREATMENT Treatment includes protecting the skin from further contact with the irritating substance by avoiding that substance if possible. Barrier creams, powders, and gloves may be  helpful. Your caregiver may also recommend:  Steroid creams or ointments applied 2 times daily. For best results, soak the rash area in cool water for 20 minutes. Then apply the medicine. Cover the area with a plastic wrap. You can store the steroid cream in the refrigerator for a "chilly" effect on your rash. That may decrease itching. Oral steroid medicines may be needed in more severe cases.  Antibiotics or antibacterial ointments if a skin infection is present.  Antihistamine lotion or an antihistamine taken by mouth to ease itching.  Lubricants to keep moisture in your skin.  Burow's solution to reduce redness and soreness or to dry a weeping rash. Mix one packet or tablet of solution in 2 cups cool water. Dip a clean washcloth in the mixture, wring it out a bit, and put it on the affected area. Leave the cloth in place for 30 minutes. Do this as often as possible throughout the day.  Taking several cornstarch or baking soda baths daily if the area is too large to cover with a washcloth. Harsh chemicals, such as alkalis or acids, can cause skin damage that is like a burn. You should flush your skin for 15 to 20 minutes with cold water after such an exposure. You should also seek immediate medical care after exposure. Bandages (dressings), antibiotics, and pain medicine may be needed for severely irritated skin.  HOME CARE INSTRUCTIONS  Avoid the substance that caused your reaction.  Keep the area of skin that is affected away from hot water, soap, sunlight, chemicals, acidic substances, or anything else that would irritate your skin.  Do not scratch the rash. Scratching may cause the rash to become infected.  You may take cool baths to help stop  the itching.  Only take over-the-counter or prescription medicines as directed by your caregiver.  See your caregiver for follow-up care as directed to make sure your skin is healing properly. SEEK MEDICAL CARE IF:   Your condition is not  better after 3 days of treatment.  You seem to be getting worse.  You see signs of infection such as swelling, tenderness, redness, soreness, or warmth in the affected area.  You have any problems related to your medicines. Document Released: 04/06/2000 Document Revised: 07/02/2011 Document Reviewed: 09/12/2010 [facility] Patient Information 2014 [place], [place].

## 2013-09-29 ENCOUNTER — Ambulatory Visit (INDEPENDENT_AMBULATORY_CARE_PROVIDER_SITE_OTHER): Payer: BC Managed Care – PPO | Admitting: Family Medicine

## 2013-09-29 ENCOUNTER — Telehealth: Payer: Self-pay

## 2013-09-29 VITALS — BP 112/74 | HR 76 | Temp 98.3°F | Resp 18 | Ht 64.0 in | Wt 152.0 lb

## 2013-09-29 DIAGNOSIS — L255 Unspecified contact dermatitis due to plants, except food: Secondary | ICD-10-CM

## 2013-09-29 DIAGNOSIS — Z79899 Other long term (current) drug therapy: Secondary | ICD-10-CM

## 2013-09-29 DIAGNOSIS — R21 Rash and other nonspecific skin eruption: Secondary | ICD-10-CM

## 2013-09-29 DIAGNOSIS — L237 Allergic contact dermatitis due to plants, except food: Secondary | ICD-10-CM

## 2013-09-29 LAB — GLUCOSE, POCT (MANUAL RESULT ENTRY): POC GLUCOSE: 79 mg/dL (ref 70–99)

## 2013-09-29 MED ORDER — PREDNISONE 20 MG PO TABS: ORAL_TABLET | ORAL | Status: DC

## 2013-09-29 NOTE — Patient Instructions (Signed)
Start prednisone as discussed. Benadryl if needed for itching. Return to the clinic or go to the nearest emergency room if any of your symptoms worsen or new symptoms occur. Poison [employer] ivy is a inflammation of the skin (contact dermatitis) caused by touching the allergens on the leaves of the ivy plant following previous exposure to the plant. The rash usually appears 48 hours after exposure. The rash is usually bumps (papules) or blisters (vesicles) in a linear pattern. Depending on your own sensitivity, the rash may simply cause redness and itching, or it may also progress to blisters which may break open. These must be well cared for to prevent secondary bacterial (germ) infection, followed by scarring. Keep any open areas dry, clean, dressed, and covered with an antibacterial ointment if needed. The eyes may also get puffy. The puffiness is worst in the morning and gets better as the day progresses. This dermatitis usually heals without scarring, within 2 to 3 weeks without treatment. HOME CARE INSTRUCTIONS  Thoroughly wash with soap and water as soon as you have been exposed to poison ivy. You have about one half hour to remove the plant resin before it will cause the rash. This washing will destroy the oil or antigen on the skin that is causing, or will cause, the rash. Be sure to wash under your fingernails as any plant resin there will continue to spread the rash. Do not rub skin vigorously when washing affected area. Poison ivy cannot spread if no oil from the plant remains on your body. A rash that has progressed to weeping sores will not spread the rash unless you have not washed thoroughly. It is also important to wash any clothes you have been wearing as these may carry active allergens. The rash will return if you wear the unwashed clothing, even several days later. Avoidance of the plant in the future is the best measure. Poison ivy plant can be recognized by the number of leaves.  Generally, poison ivy has three leaves with flowering branches on a single stem. Diphenhydramine may be purchased over the counter and used as needed for itching. Do not drive with this medication if it makes you drowsy.Ask your caregiver about medication for children. SEEK MEDICAL CARE IF:  Open sores develop.  Redness spreads beyond area of rash.  You notice purulent (pus-like) discharge.  You have increased pain.  Other signs of infection develop (such as fever). Document Released: 04/06/2000 Document Revised: 07/02/2011 Document Reviewed: 02/23/2009 [facility] Patient Information 2014 [place], [place].

## 2013-09-29 NOTE — Telephone Encounter (Signed)
PT STATES SHE WAS SEEN FOR POISON IVY AND GIVEN SOME MEDICINE, SHE IS NOW COVERED IN IT AND DIDN'T KNOW IF THERE WAS ANYTHING ORALLY SHE COULD TAKE PLEASE CALL [phone] AND LEAVE MESSAGE

## 2013-09-29 NOTE — Telephone Encounter (Signed)
Lm for pt to RTC 

## 2013-09-29 NOTE — Progress Notes (Signed)
Subjective:    Patient ID: Danielle Fischer, female    DOB: 03/23/1951, 63 y.o.   MRN: [id number]  HPI Danielle Fischer is a 63 y.o. female  Seen 1 week ago with rash on arm after working in yard  3 days prior. area on her left forearm initially,  then dark area on her left upper, outer leg  Left upper arm began itching, noticed it was red and swollen. Diagnosed with possible bug bite, contact dermatitis, Rx clobetasol topical cream. Phone note today to return as rash spreading. Thought was poison ivy. Tried clobetasol cream - min relief.  Hot water helped itch for times.   Since last week - new areas have spread - both legs, both arms., and spot of left face past few days. No trunk lesions., no genital or mouth lesions.     Patient Active Problem List   Diagnosis Date Noted  . Seasonal affective disorder 09/08/2012  . Hyperlipidemia 04/25/2011  . Uterine prolapse   . Cystocele   . Rectocele   . SUI (stress urinary incontinence, female)   . Osteopenia   . GERD 05/24/2009  . HYPERTENSION 11/10/2007   Past Medical History  Diagnosis Date  . Uterine prolapse   . Cystocele   . Rectocele   . SUI (stress urinary incontinence, female)   . Osteopenia   . Hypertension    Past Surgical History  Procedure Laterality Date  . Vaginal hysterectomy  1993    A/P Repair  . Colonoscopy  2003    negative   Allergies  Allergen Reactions  . Erythromycin     REACTION: GI UPSET   Prior to Admission medications   Medication Sig Start Date End Date Taking? Authorizing Provider  amitriptyline (ELAVIL) 25 MG tablet TAKE 1 TABLET BY MOUTH EVERY NIGHT AT BEDTIME 08/06/13  Yes Hendricks Limes, MD  Calcium Carbonate-Vitamin D (CALCIUM + D PO) Take by mouth 2 (two) times daily.     Yes Historical Provider, MD  clobetasol cream (TEMOVATE) 7.82 % Apply 1 application topically 2 (two) times daily. 09/22/13  Yes Elby Beck, FNP  metoprolol succinate (TOPROL-XL) 100 MG 24 hr tablet TAKE ONE HALF  TABLET BY MOUTH DAILY. 09/22/13  Yes Hendricks Limes, MD  Multiple Vitamin (MULTIVITAMIN) tablet Take 1 tablet by mouth daily.     Yes Historical Provider, MD   History   Social History  . Marital Status: Married    Spouse Name: N/A    Number of Children: N/A  . Years of Education: N/A   Occupational History  . Not on file.   Social History Main Topics  . Smoking status: Former Research scientist (life sciences)  . Smokeless tobacco: Not on file     Comment: smoked ages [age], up to 5 cigarettes/day  . Alcohol Use: 2.4 oz/week    4 Glasses of wine per week     Comment:  socially on weekends  . Drug Use: No  . Sexual Activity: Yes    Birth Control/ Protection: Surgical   Other Topics Concern  . Not on file   Social History Narrative  . No narrative on file       Review of Systems  Constitutional: Negative for fever and chills.  Respiratory: Negative for shortness of breath.   Skin: Positive for rash.       Objective:   Physical Exam  Vitals reviewed. Constitutional: She is oriented to person, place, and time. She appears well-developed and well-nourished.  Pulmonary/Chest:  Effort normal and breath sounds normal.  Neurological: She is alert and oriented to person, place, and time.  Skin: Skin is warm and dry. Rash noted. Rash is papular and vesicular. There is erythema.     Diffuse patches of erythema, linear component in areas with clustered patches of faint vesicles.  Few patches with small central eschar, and excoriation.    Psychiatric: She has a normal mood and affect. Her behavior is normal.   Filed Vitals:   09/29/13 1543  BP: 112/74  Pulse: 76  Temp: 98.3 F (36.8 C)  TempSrc: Oral  Resp: 18  Height: 5\' 4"  (1.626 m)  Weight: 152 lb (68.947 kg)  SpO2: 99%   Results for orders placed in visit on 09/29/13  GLUCOSE, POCT (MANUAL RESULT ENTRY)      Result Value Ref Range   POC Glucose 79  70 - 99 mg/dl      Assessment & Plan:   Danielle Fischer is a 63 y.o. female Rash  and nonspecific skin eruption - Plan: predniSONE (DELTASONE) 20 MG tablet  Contact dermatitis due to poison ivy - Plan: predniSONE (DELTASONE) 20 MG tablet  High Fischer medication use - Plan: POCT glucose (manual entry) Suspected poison ivy/oak with secondary spread.  Now with multiple new areas and involving face. No mucosal lesions.   - start prednisone 9 day taper, SED and RTC precautions discussed.   - benadryl prn itching.   Glucose ok (prior glucose in [facility] borderline elevated)  Meds ordered this encounter  Medications  . predniSONE (DELTASONE) 20 MG tablet    Sig: 3 tabs by mouth each day for 2 days, 2 tabs by mouth each day for 2 days, 1 tab by mouth each day for 2 days, 1/2 tab by mouth each day for 2 days.    Dispense:  16 tablet    Refill:  0   Patient Instructions  Start prednisone as discussed. Benadryl if needed for itching. Return to the clinic or go to the nearest emergency room if any of your symptoms worsen or new symptoms occur. Poison [employer] ivy is a inflammation of the skin (contact dermatitis) caused by touching the allergens on the leaves of the ivy plant following previous exposure to the plant. The rash usually appears 48 hours after exposure. The rash is usually bumps (papules) or blisters (vesicles) in a linear pattern. Depending on your own sensitivity, the rash may simply cause redness and itching, or it may also progress to blisters which may break open. These must be well cared for to prevent secondary bacterial (germ) infection, followed by scarring. Keep any open areas dry, clean, dressed, and covered with an antibacterial ointment if needed. The eyes may also get puffy. The puffiness is worst in the morning and gets better as the day progresses. This dermatitis usually heals without scarring, within 2 to 3 weeks without treatment. HOME CARE INSTRUCTIONS  Thoroughly wash with soap and water as soon as you have been exposed to poison ivy. You have about one  half hour to remove the plant resin before it will cause the rash. This washing will destroy the oil or antigen on the skin that is causing, or will cause, the rash. Be sure to wash under your fingernails as any plant resin there will continue to spread the rash. Do not rub skin vigorously when washing affected area. Poison ivy cannot spread if no oil from the plant remains on your body. A rash that has progressed to  weeping sores will not spread the rash unless you have not washed thoroughly. It is also important to wash any clothes you have been wearing as these may carry active allergens. The rash will return if you wear the unwashed clothing, even several days later. Avoidance of the plant in the future is the best measure. Poison ivy plant can be recognized by the number of leaves. Generally, poison ivy has three leaves with flowering branches on a single stem. Diphenhydramine may be purchased over the counter and used as needed for itching. Do not drive with this medication if it makes you drowsy.Ask your caregiver about medication for children. SEEK MEDICAL CARE IF:  Open sores develop.  Redness spreads beyond area of rash.  You notice purulent (pus-like) discharge.  You have increased pain.  Other signs of infection develop (such as fever). Document Released: 04/06/2000 Document Revised: 07/02/2011 Document Reviewed: 02/23/2009 [facility] Patient Information 2014 [place], [place].

## 2013-10-14 ENCOUNTER — Encounter: Payer: Self-pay | Admitting: Internal Medicine

## 2013-11-02 ENCOUNTER — Other Ambulatory Visit: Payer: Self-pay | Admitting: Internal Medicine

## 2013-11-02 NOTE — Telephone Encounter (Signed)
Last office visit 09/08/12 Med last filled 08/06/13

## 2013-11-02 NOTE — Telephone Encounter (Signed)
#  30 OV before any other refills

## 2013-12-06 ENCOUNTER — Other Ambulatory Visit: Payer: Self-pay | Admitting: Internal Medicine

## 2013-12-07 NOTE — Telephone Encounter (Signed)
OK X 1 BUT NR w/o F/U Last OV 5/14

## 2013-12-07 NOTE — Telephone Encounter (Signed)
09/08/12 last office visit with you  11/02/13 med last filled #30 no refills

## 2014-01-03 ENCOUNTER — Other Ambulatory Visit: Payer: Self-pay | Admitting: Internal Medicine

## 2014-01-04 NOTE — Telephone Encounter (Signed)
330  OV needed before refills Last seen 5/14; multiple OV to [facility] since 5/14

## 2014-01-04 NOTE — Telephone Encounter (Signed)
09/08/12 last office visit

## 2014-02-04 ENCOUNTER — Other Ambulatory Visit: Payer: Self-pay | Admitting: Internal Medicine

## 2014-02-05 NOTE — Telephone Encounter (Signed)
5.9.14 last office visit with you

## 2014-02-05 NOTE — Telephone Encounter (Signed)
   Declined; last seen 5/14.  Since that time she's been seen [facility] 3 times in 2015.  Refill would require Reestablishing active care.

## 2014-02-15 ENCOUNTER — Telehealth: Payer: Self-pay | Admitting: Internal Medicine

## 2014-02-15 MED ORDER — AMITRIPTYLINE HCL 25 MG PO TABS: ORAL_TABLET | ORAL | Status: DC

## 2014-02-15 NOTE — Telephone Encounter (Signed)
Patient has appt Thursday for cpe.  She is out of amitriptyline.  She is requesting a small script to be called into [facility] at [address]

## 2014-02-15 NOTE — Telephone Encounter (Signed)
Notified pt 30 day sent to [facility]...Johny Chess

## 2014-02-18 ENCOUNTER — Ambulatory Visit (INDEPENDENT_AMBULATORY_CARE_PROVIDER_SITE_OTHER): Payer: BC Managed Care – PPO | Admitting: Internal Medicine

## 2014-02-18 ENCOUNTER — Encounter: Payer: Self-pay | Admitting: Internal Medicine

## 2014-02-18 VITALS — BP 146/90 | HR 78 | Temp 98.0°F | Resp 12 | Ht 64.0 in | Wt 150.2 lb

## 2014-02-18 DIAGNOSIS — Z0189 Encounter for other specified special examinations: Secondary | ICD-10-CM

## 2014-02-18 DIAGNOSIS — Z0000 Encounter for general adult medical examination without abnormal findings: Secondary | ICD-10-CM

## 2014-02-18 DIAGNOSIS — M8580 Other specified disorders of bone density and structure, unspecified site: Secondary | ICD-10-CM

## 2014-02-18 DIAGNOSIS — I10 Essential (primary) hypertension: Secondary | ICD-10-CM

## 2014-02-18 DIAGNOSIS — E785 Hyperlipidemia, unspecified: Secondary | ICD-10-CM

## 2014-02-18 MED ORDER — AMITRIPTYLINE HCL 25 MG PO TABS: ORAL_TABLET | ORAL | Status: DC

## 2014-02-18 MED ORDER — METOPROLOL SUCCINATE ER 100 MG PO TB24: ORAL_TABLET | ORAL | Status: DC

## 2014-02-18 NOTE — Progress Notes (Signed)
Pre visit review using our clinic review tool, if applicable. No additional management support is needed unless otherwise documented below in the visit note. 

## 2014-02-18 NOTE — Patient Instructions (Addendum)
Minimal Blood Pressure Goal= AVERAGE < 140/90;  Ideal is an AVERAGE < 135/85. This AVERAGE should be calculated from @ least 5-7 BP readings taken @ different times of day on different days of week. You should not respond to isolated BP readings , but rather the AVERAGE for that week .Please bring your  blood pressure cuff to office visits to verify that it is reliable.It  can also be checked against the blood pressure device at the pharmacy. Finger or wrist cuffs are not dependable; an arm cuff is.  Cardiovascular exercise, this can be as simple a program as walking, is recommended 30-45 minutes 3-4 times per week. If you're not exercising you should take 6-8 weeks to build up to this level.   

## 2014-02-18 NOTE — Progress Notes (Signed)
   Subjective:    Patient ID: Danielle Fischer, female    DOB: Sep 25, 1950, 63 y.o.   MRN: [id number]  HPI  She is here for a physical;acute issues include work & adult child stresses.  Not on heart healthy diet; not restricting salt.No regular CVE.No home BP monitor.    Review of Systems Off Amitriptyline her anxiety flared dramatically. Symptoms improved dramatically with resumption of this med.  Chest pain, palpitations, tachycardia, exertional dyspnea, paroxysmal nocturnal dyspnea, claudication or edema are absent.           Objective:   Physical Exam Gen.: Healthy and well-nourished in appearance. Alert, appropriate and cooperative throughout exam. Appears younger than stated age  Head: Normocephalic without obvious abnormalities  Eyes: No corneal or conjunctival inflammation noted. Pupils equal round reactive to light and accommodation. Extraocular motion intact. Ears: External  ear exam reveals no significant lesions or deformities. Canals clear .TMs normal. Hearing is grossly normal bilaterally. Nose: External nasal exam reveals no deformity or inflammation. Nasal mucosa are pink and moist. No lesions or exudates noted.   Mouth: Oral mucosa and oropharynx reveal no lesions or exudates. Teeth in good repair. Neck: No deformities, masses, or tenderness noted. Range of motion &Thyroid normal. Lungs: Normal respiratory effort; chest expands symmetrically. Lungs are clear to auscultation without rales, wheezes, or increased work of breathing. Heart: Normal rate and rhythm. Normal S1 and S2. No gallop, click, or rub. S4 w/o murmur. Abdomen: Bowel sounds normal; abdomen soft and nontender. No masses, organomegaly or hernias noted.Aorta palpable ; no AAA Genitalia: as per Gyn                                  Musculoskeletal/extremities: No deformity or scoliosis noted of  the thoracic or lumbar spine.  No clubbing, cyanosis, edema, or significant extremity  deformity noted. Range of  motion normal .Tone & strength normal. Hand joints normal  Fingernail / toenail health good. Able to lie down & sit up w/o help. Negative SLR bilaterally Vascular: Carotid, radial artery, dorsalis pedis and  posterior tibial pulses are full and equal. No bruits present. Neurologic: Alert and oriented x3. Deep tendon reflexes symmetrical and normal.  Gait normal  Skin: Intact without suspicious lesions or rashes. Lymph: No cervical, axillary lymphadenopathy present. Psych: Mood and affect are normal. Normally interactive                                                                                        Assessment & Plan:  #1 comprehensive physical exam; no acute findings  Plan: see Orders  & Recommendations

## 2014-02-19 ENCOUNTER — Telehealth: Payer: Self-pay | Admitting: Internal Medicine

## 2014-02-19 NOTE — Telephone Encounter (Signed)
emmi emailed °

## 2014-02-22 ENCOUNTER — Encounter: Payer: Self-pay | Admitting: Internal Medicine

## 2014-03-03 ENCOUNTER — Other Ambulatory Visit (INDEPENDENT_AMBULATORY_CARE_PROVIDER_SITE_OTHER): Payer: BC Managed Care – PPO

## 2014-03-03 DIAGNOSIS — Z0000 Encounter for general adult medical examination without abnormal findings: Secondary | ICD-10-CM

## 2014-03-03 DIAGNOSIS — Z0189 Encounter for other specified special examinations: Secondary | ICD-10-CM

## 2014-03-03 LAB — BASIC METABOLIC PANEL
BUN: 20 mg/dL (ref 6–23)
CO2: 24 mEq/L (ref 19–32)
Calcium: 9.7 mg/dL (ref 8.4–10.5)
Chloride: 104 mEq/L (ref 96–112)
Creatinine, Ser: 1.2 mg/dL (ref 0.4–1.2)
GFR: 50.15 mL/min — AB (ref >60.00)
Glucose, Bld: 99 mg/dL (ref 70–99)
POTASSIUM: 5.2 meq/L — AB (ref 3.5–5.1)
SODIUM: 140 meq/L (ref 135–145)

## 2014-03-03 LAB — CBC WITH DIFFERENTIAL/PLATELET
BASOS ABS: 0 10*3/uL (ref 0.0–0.1)
BASOS PCT: 0.6 % (ref 0.0–3.0)
Eosinophils Absolute: 0.2 10*3/uL (ref 0.0–0.7)
Eosinophils Relative: 3.7 % (ref 0.0–5.0)
HEMATOCRIT: 40.9 % (ref 36.0–46.0)
HEMOGLOBIN: 13.4 g/dL (ref 12.0–15.0)
Lymphocytes Relative: 40.6 % (ref 12.0–46.0)
Lymphs Abs: 2.5 10*3/uL (ref 0.7–4.0)
MCHC: 32.8 g/dL (ref 30.0–36.0)
MCV: 94.8 fl (ref 78.0–100.0)
MONOS PCT: 7.1 % (ref 3.0–12.0)
Monocytes Absolute: 0.4 10*3/uL (ref 0.1–1.0)
NEUTROS ABS: 3 10*3/uL (ref 1.4–7.7)
Neutrophils Relative %: 48 % (ref 43.0–77.0)
Platelets: 253 10*3/uL (ref 150.0–400.0)
RBC: 4.31 Mil/uL (ref 3.87–5.11)
RDW: 12.4 % (ref 11.5–15.5)
WBC: 6.2 10*3/uL (ref 4.0–10.5)

## 2014-03-03 LAB — LIPID PANEL
Cholesterol: 203 mg/dL — ABNORMAL HIGH (ref 0–200)
HDL: 69 mg/dL (ref >39.00)
LDL Cholesterol: 118 mg/dL — ABNORMAL HIGH (ref 0–99)
NONHDL: 134
Total CHOL/HDL Ratio: 3
Triglycerides: 80 mg/dL (ref 0.0–149.0)
VLDL: 16 mg/dL (ref 0.0–40.0)

## 2014-03-03 LAB — HEPATIC FUNCTION PANEL
ALBUMIN: 3.5 g/dL (ref 3.5–5.2)
ALK PHOS: 69 U/L (ref 39–117)
ALT: 17 U/L (ref 0–35)
AST: 20 U/L (ref 0–37)
Bilirubin, Direct: 0.1 mg/dL (ref 0.0–0.3)
Total Bilirubin: 0.7 mg/dL (ref 0.2–1.2)
Total Protein: 7 g/dL (ref 6.0–8.3)

## 2014-03-03 LAB — TSH: TSH: 1.54 u[IU]/mL (ref 0.35–4.50)

## 2014-03-04 ENCOUNTER — Other Ambulatory Visit: Payer: Self-pay | Admitting: Internal Medicine

## 2014-03-26 ENCOUNTER — Other Ambulatory Visit: Payer: Self-pay | Admitting: Internal Medicine

## 2014-10-15 LAB — HM MAMMOGRAPHY

## 2014-10-21 ENCOUNTER — Encounter: Payer: Self-pay | Admitting: Internal Medicine

## 2014-11-24 ENCOUNTER — Ambulatory Visit: Payer: BC Managed Care – PPO | Admitting: Gynecology

## 2014-12-20 ENCOUNTER — Other Ambulatory Visit: Payer: Self-pay | Admitting: Internal Medicine

## 2014-12-20 NOTE — Telephone Encounter (Signed)
Last OV & Refill 10/15 please advise

## 2014-12-20 NOTE — Telephone Encounter (Signed)
OK X1  My retirement date is 04/23/2015; but I will be in office on a limited schedule Oct-Dec. To guarantee continuity of care you should transition your care to another PCP by Oct 1,2016.     

## 2014-12-21 ENCOUNTER — Other Ambulatory Visit: Payer: Self-pay | Admitting: Emergency Medicine

## 2014-12-21 MED ORDER — AMITRIPTYLINE HCL 25 MG PO TABS: ORAL_TABLET | ORAL | Status: DC

## 2015-02-08 ENCOUNTER — Telehealth: Payer: Self-pay | Admitting: *Deleted

## 2015-02-08 NOTE — Telephone Encounter (Signed)
Danielle Fischer complains of posterior heel and ankle pain and numbness that runs in to her toes, what to do, until 02/21/2015 appt?  I told Danielle Fischer to minimize walking, ice, if able to tolerate OTC antiinflammatories take as package instructions, and transferred to schedulers to see if earlier appt, or if worsens go to ER, primary doctor.

## 2015-02-21 ENCOUNTER — Encounter: Payer: Self-pay | Admitting: Podiatry

## 2015-02-21 ENCOUNTER — Ambulatory Visit (INDEPENDENT_AMBULATORY_CARE_PROVIDER_SITE_OTHER): Payer: BC Managed Care – PPO | Admitting: Podiatry

## 2015-02-21 ENCOUNTER — Ambulatory Visit (INDEPENDENT_AMBULATORY_CARE_PROVIDER_SITE_OTHER): Payer: BC Managed Care – PPO

## 2015-02-21 VITALS — BP 155/78 | HR 78 | Resp 16

## 2015-02-21 DIAGNOSIS — M779 Enthesopathy, unspecified: Secondary | ICD-10-CM | POA: Diagnosis not present

## 2015-02-21 MED ORDER — TRIAMCINOLONE ACETONIDE 10 MG/ML IJ SUSP
10.0000 mg | Freq: Once | INTRAMUSCULAR | Status: AC
Start: 2015-02-21 — End: 2015-02-21
  Administered 2015-02-21: 10 mg

## 2015-02-21 NOTE — Progress Notes (Signed)
   Subjective:    Patient ID: Danielle Fischer, female    DOB: [birth year], 64 y.o.   MRN: [id number]  HPI Pt presents with pain and swlling on the lateral side of her right ankle, lasting for several weeks and worsening. She also c/o pain across her toes. She has tried icing and nsaids with not much relief   Review of Systems  All other systems reviewed and are negative.      Objective:   Physical Exam        Assessment & Plan:

## 2015-02-25 NOTE — Progress Notes (Signed)
Subjective:     Patient ID: Danielle Fischer, female   DOB: 31-Jul-1950, 64 y.o.   MRN: [id number]  HPI patient presents with discomfort on the outside of her right foot and top that has moderated quite a bit but is still painful and she was just concerned that this can be a problem and she wanted to get it checked   Review of Systems  All other systems reviewed and are negative.      Objective:   Physical Exam  Constitutional: She is oriented to person, place, and time.  Cardiovascular: Intact distal pulses.   Musculoskeletal: Normal range of motion.  Neurological: She is oriented to person, place, and time.  Skin: Skin is warm.  Nursing note and vitals reviewed.  neurovascular status intact muscle strength adequate range of motion within normal limits with patient noted to have moderate discomfort in the lateral side of the right foot with swelling noted and inflammation noted upon palpation with no indication of muscle strength loss. The pain is moderately intense and the toes themselves are minimally discomforting not to the same degree as the outside area     Assessment:     Probable tendinitis of the peroneal group right with no indications of muscle dysfunction    Plan:     H&P and x-rays and condition discussed. I've recommended careful sheath injection which was accomplished 3 mg dexamethasone Kenalog 5 mg Xylocaine and utilization of physical therapy

## 2015-03-18 ENCOUNTER — Other Ambulatory Visit: Payer: Self-pay | Admitting: Internal Medicine

## 2015-03-18 ENCOUNTER — Telehealth: Payer: Self-pay | Admitting: Internal Medicine

## 2015-03-18 MED ORDER — METOPROLOL SUCCINATE ER 100 MG PO TB24: ORAL_TABLET | ORAL | Status: DC

## 2015-03-18 NOTE — Telephone Encounter (Signed)
Appt has been scheduled with Dr Quay Burow. Refills sent in for a month

## 2015-03-18 NOTE — Telephone Encounter (Signed)
Patient called to schedule CPE.  We are unable to schedule with Dr. Linna Darner.  Patient would like to know who Dr. Linna Darner recommends.  She is also asking that toprol xl and amitriptyline be refilled until she can get switched to another provider.

## 2015-03-18 NOTE — Telephone Encounter (Signed)
Pt will need to Establish with Dr Quay Burow or Terri Piedra, NP. Refills will be sent once appt is scheduled. LVM for pt to call back.

## 2015-03-29 ENCOUNTER — Other Ambulatory Visit: Payer: Self-pay | Admitting: Emergency Medicine

## 2015-03-29 MED ORDER — METOPROLOL SUCCINATE ER 100 MG PO TB24: ORAL_TABLET | ORAL | Status: DC

## 2015-04-04 ENCOUNTER — Ambulatory Visit (INDEPENDENT_AMBULATORY_CARE_PROVIDER_SITE_OTHER): Payer: BC Managed Care – PPO | Admitting: Internal Medicine

## 2015-04-04 ENCOUNTER — Encounter: Payer: Self-pay | Admitting: Internal Medicine

## 2015-04-04 VITALS — BP 134/88 | HR 82 | Temp 98.5°F | Resp 16 | Wt 150.0 lb

## 2015-04-04 DIAGNOSIS — I10 Essential (primary) hypertension: Secondary | ICD-10-CM

## 2015-04-04 MED ORDER — METOPROLOL SUCCINATE ER 50 MG PO TB24: 50.0000 mg | ORAL_TABLET | Freq: Every day | ORAL | Status: DC

## 2015-04-04 MED ORDER — AMITRIPTYLINE HCL 25 MG PO TABS: ORAL_TABLET | ORAL | Status: DC

## 2015-04-04 NOTE — Progress Notes (Signed)
Subjective:    Patient ID: Danielle Fischer, female    DOB: September 25, 1950, 64 y.o.   MRN: [id number]  HPI She is here to establish with a new pcp. She is here for routine follow up   Hypertension: She is taking her medication daily. She is compliant with a low sodium diet.  She denies chest pain, palpitations, edema, shortness of breath and regular headaches. She is not exercising regularly.  She does not monitor her blood pressure at home, but knows she should start.    She is taking elavil nightly and has been taking it for years.  She has one child that has always been difficult and when he was younger she was started on this medication for neck pain, stomach symptoms and muscle pain.  She has several tests and everything was normal.  After starting the medication her symptoms resolved.  She has tried going off the medication and her symptoms return.  She does have stress in her life.  Her son is battling addiction and she is an [employer].      Medications and allergies reviewed with patient and updated if appropriate.  Patient Active Problem List   Diagnosis Date Noted  . Seasonal affective disorder ([place]) 09/08/2012  . Hyperlipidemia 04/25/2011  . Uterine prolapse   . Cystocele   . Rectocele   . SUI (stress urinary incontinence, female)   . Osteopenia   . GERD 05/24/2009  . HYPERTENSION 11/10/2007    Current Outpatient Prescriptions on File Prior to Visit  Medication Sig Dispense Refill  . amitriptyline (ELAVIL) 25 MG tablet TAKE 1 TABLET BY MOUTH EVERY NIGHT AT BEDTIME 30 tablet 0  . Calcium Carbonate-Vitamin D (CALCIUM + D PO) Take by mouth 2 (two) times daily.      . metoprolol succinate (TOPROL-XL) 100 MG 24 hr tablet TAKE 1/2 TABLET BY MOUTH EVERY DAY 90 tablet 1  . Multiple Vitamin (MULTIVITAMIN) tablet Take 1 tablet by mouth daily.       No current facility-administered medications on file prior to visit.    Past Medical History  Diagnosis Date  . Uterine  prolapse   . Cystocele   . Rectocele   . SUI (stress urinary incontinence, female)   . Osteopenia   . Hypertension     Past Surgical History  Procedure Laterality Date  . Vaginal hysterectomy  1993    A/P Repair  . Colonoscopy  2009    negative; Dr Michaelyn Barter    Social History   Social History  . Marital Status: Married    Spouse Name: N/A  . Number of Children: N/A  . Years of Education: N/A   Social History Main Topics  . Smoking status: Former Research scientist (life sciences)  . Smokeless tobacco: Not on file     Comment: smoked ages [age], up to 5 cigarettes/day  . Alcohol Use: 2.4 oz/week    4 Glasses of wine per week     Comment:  socially on weekends  . Drug Use: No  . Sexual Activity: Yes    Birth Control/ Protection: Surgical   Other Topics Concern  . Not on file   Social History Narrative    Review of Systems  Constitutional: Negative for fever and chills.  Respiratory: Negative for cough, shortness of breath and wheezing.   Cardiovascular: Negative for chest pain, palpitations and leg swelling.  Gastrointestinal: Negative for nausea and abdominal pain.  Neurological: Negative for dizziness, light-headedness and headaches.  Psychiatric/Behavioral: Negative  for dysphoric mood. The patient is not nervous/anxious.        Objective:   Filed Vitals:   04/04/15 1546  BP: 134/88  Pulse: 82  Temp: 98.5 F (36.9 C)  Resp: 16   Filed Weights   04/04/15 1546  Weight: 150 lb (68.04 kg)   Body mass index is 25.73 kg/(m^2).   Physical Exam Constitutional: Appears well-developed and well-nourished. No distress.  Neck: Neck supple. No tracheal deviation present. No thyromegaly present.  No carotid bruit. No cervical adenopathy.   Cardiovascular: Normal rate, regular rhythm and normal heart sounds.   No murmur heard. Pulmonary/Chest: Effort normal and breath sounds normal. No respiratory distress. No wheezes.  Musculoskeletal: No edema.  Psych:  Normal mood and  affect      Assessment & Plan:   Taking elavil for a number of reasons - likely underlying anxiety, depression that manifested in physical complaints Symptoms controlled and returned if she tried to get off medication Continue elavil 25 mg a bedtime  See Problem List.  Follow up annually

## 2015-04-04 NOTE — Assessment & Plan Note (Signed)
BP borderline high today She will buy a cuff and start monitoring  Increase exercise Continue metoprolol XL 50 mg daily  Follow up annually

## 2015-04-04 NOTE — Patient Instructions (Signed)
  We have reviewed your prior records including labs and tests today.  All other Health Maintenance issues reviewed.   All recommended immunizations and age-appropriate screenings are up-to-date.  No immunizations administered today.   Medications reviewed and updated.  No changes recommended at this time.  Your prescription(s) have been submitted to your pharmacy. Please take as directed and contact our office if you believe you are having problem(s) with the medication(s).  Please schedule followup in one year

## 2015-04-04 NOTE — Progress Notes (Signed)
Pre visit review using our clinic review tool, if applicable. No additional management support is needed unless otherwise documented below in the visit note. 

## 2015-05-26 ENCOUNTER — Other Ambulatory Visit: Payer: Self-pay | Admitting: Internal Medicine

## 2015-12-10 LAB — HM MAMMOGRAPHY

## 2015-12-19 ENCOUNTER — Encounter: Payer: Self-pay | Admitting: Internal Medicine

## 2015-12-19 NOTE — Progress Notes (Unsigned)
08/19/ 

## 2016-04-12 ENCOUNTER — Ambulatory Visit (INDEPENDENT_AMBULATORY_CARE_PROVIDER_SITE_OTHER): Payer: BC Managed Care – PPO

## 2016-04-12 ENCOUNTER — Ambulatory Visit (INDEPENDENT_AMBULATORY_CARE_PROVIDER_SITE_OTHER): Payer: BC Managed Care – PPO | Admitting: Physician Assistant

## 2016-04-12 VITALS — BP 122/70 | HR 80 | Temp 97.7°F | Resp 16 | Ht 64.0 in | Wt 155.0 lb

## 2016-04-12 DIAGNOSIS — R0981 Nasal congestion: Secondary | ICD-10-CM

## 2016-04-12 DIAGNOSIS — J069 Acute upper respiratory infection, unspecified: Secondary | ICD-10-CM

## 2016-04-12 DIAGNOSIS — R05 Cough: Secondary | ICD-10-CM | POA: Diagnosis not present

## 2016-04-12 DIAGNOSIS — R059 Cough, unspecified: Secondary | ICD-10-CM

## 2016-04-12 LAB — POCT CBC
Granulocyte percent: 67.4 % (ref 37–80)
HCT, POC: 35.8 % — AB (ref 37.7–47.9)
Hemoglobin: 12.3 g/dL (ref 12.2–16.2)
Lymph, poc: 2.3 (ref 0.6–3.4)
MCH, POC: 31.2 pg (ref 27–31.2)
MCHC: 34.5 g/dL (ref 31.8–35.4)
MCV: 90.4 fL (ref 80–97)
MID (cbc): 0.6 (ref 0–0.9)
MPV: 6.7 fL (ref 0–99.8)
POC Granulocyte: 6 (ref 2–6.9)
POC LYMPH PERCENT: 25.4 %L (ref 10–50)
POC MID %: 7.2 %M (ref 0–12)
Platelet Count, POC: 245 10*3/uL (ref 142–424)
RBC: 3.95 M/uL — AB (ref 4.04–5.48)
RDW, POC: 12.5 %
WBC: 8.9 10*3/uL (ref 4.6–10.2)

## 2016-04-12 MED ORDER — AZITHROMYCIN 250 MG PO TABS: ORAL_TABLET | ORAL | 0 refills | Status: DC

## 2016-04-12 MED ORDER — PREDNISONE 20 MG PO TABS: ORAL_TABLET | ORAL | 0 refills | Status: DC

## 2016-04-12 NOTE — Progress Notes (Signed)
Danielle Fischer  MRN: [id number] DOB: 04-30-1950  PCP: Binnie Rail, MD  Subjective:  Pt is a 65 year old female who presents to clinic for illness x eight days.  Started 8 days ago with headache and sore throat. Has progressed to "terrible congestion". + productive cough. She is getting worse every day. She feels like she has had mild fever.  She has tried [employer], worked for two days. 12-hour sudafed x three days, worked "somewhat well". Mucinex DM yesterday, helps some.  No known sick contacts. Has had flu shot this season.   Denies chills, chest pain, chest pressure, SOB, wheezing, palpitations, difficulty sleeping.   Review of Systems  Constitutional: Negative for chills, diaphoresis, fatigue and fever.  HENT: Positive for congestion, postnasal drip, rhinorrhea, sinus pressure and sore throat. Negative for sneezing.   Respiratory: Positive for cough. Negative for chest tightness, shortness of breath and wheezing.   Cardiovascular: Negative for chest pain and palpitations.  Gastrointestinal: Negative for abdominal pain, diarrhea, nausea and vomiting.  Neurological: Positive for headaches. Negative for weakness and light-headedness.  Psychiatric/Behavioral: Negative for sleep disturbance.    Patient Active Problem List   Diagnosis Date Noted  . Seasonal affective disorder ([place]) 09/08/2012  . Hyperlipidemia 04/25/2011  . Uterine prolapse   . Cystocele   . Rectocele   . SUI (stress urinary incontinence, female)   . Osteopenia   . Essential hypertension 11/10/2007    Current Outpatient Prescriptions on File Prior to Visit  Medication Sig Dispense Refill  . amitriptyline (ELAVIL) 25 MG tablet TAKE 1 TABLET BY MOUTH EVERY NIGHT AT BEDTIME 90 tablet 3  . Calcium Carbonate-Vitamin D (CALCIUM + D PO) Take by mouth 2 (two) times daily.      . metoprolol succinate (TOPROL-XL) 50 MG 24 hr tablet Take 1 tablet (50 mg total) by mouth daily. Take with or immediately following  a meal. 90 tablet 3  . Multiple Vitamin (MULTIVITAMIN) tablet Take 1 tablet by mouth daily.       No current facility-administered medications on file prior to visit.     Allergies  Allergen Reactions  . Erythromycin     REACTION: GI UPSET     Objective:  BP 122/70   Pulse 80   Temp 97.7 F (36.5 C) (Oral)   Resp 16   Ht 5\' 4"  (1.626 m)   Wt 155 lb (70.3 kg)   SpO2 98%   BMI 26.61 kg/m   Physical Exam  Constitutional: She is oriented to person, place, and time and well-developed, well-nourished, and in no distress. No distress.  Cardiovascular: Normal rate, regular rhythm and normal heart sounds.   Pulmonary/Chest: Effort normal. She has wheezes in the right upper field, the right middle field, the left upper field and the left middle field. She has rhonchi in the right middle field and the left middle field. She has no rales.  Neurological: She is alert and oriented to person, place, and time. GCS score is 15.  Skin: Skin is warm and dry.  Psychiatric: Mood, memory, affect and judgment normal.  Vitals reviewed.   Dg Chest 2 View  Result Date: 04/12/2016 CLINICAL DATA:  Cough and fever EXAM: CHEST  2 VIEW COMPARISON:  None. FINDINGS: The heart size and mediastinal contours are within normal limits. Both lungs are clear. The visualized skeletal structures are unremarkable. IMPRESSION: No active cardiopulmonary disease. Electronically Signed   By: Inez Catalina M.D.   On: 04/12/2016 15:18  Results for orders placed or performed in visit on 04/12/16  POCT CBC  Result Value Ref Range   WBC 8.9 4.6 - 10.2 K/uL   Lymph, poc 2.3 0.6 - 3.4   POC LYMPH PERCENT 25.4 10 - 50 %L   MID (cbc) 0.6 0 - 0.9   POC MID % 7.2 0 - 12 %M   POC Granulocyte 6.0 2 - 6.9   Granulocyte percent 67.4 37 - 80 %G   RBC 3.95 (A) 4.04 - 5.48 M/uL   Hemoglobin 12.3 12.2 - 16.2 g/dL   HCT, POC 35.8 (A) 37.7 - 47.9 %   MCV 90.4 80 - 97 fL   MCH, POC 31.2 27 - 31.2 pg   MCHC 34.5 31.8 - 35.4 g/dL    RDW, POC 12.5 %   Platelet Count, POC 245 142 - 424 K/uL   MPV 6.7 0 - 99.8 fL   Assessment and Plan :  1. Upper respiratory tract infection, unspecified type 2. Cough 3. Nasal congestion - predniSONE (DELTASONE) 20 MG tablet; Take 3 PO QAM x3days, 2 PO QAM x3days, 1 PO QAM x3days  Dispense: 18 tablet; Refill: 0 - azithromycin (ZITHROMAX) 250 MG tablet; Take 2 tabs PO x 1 dose, then 1 tab PO QD x 4 days  Dispense: 6 tablet; Refill: 0 - POCT CBC - DG Chest 2 View; Future - Pt condition seems to be worsening. Will treat. Start steroid pack. When this is finished, if still not better, take the Z-pack. Push fluids, rest, nasal rinses.  RTC in 5-7 days if no improvement.   Mercer Pod, PA-C  [facility] 04/12/2016 2:52 PM

## 2016-04-12 NOTE — Patient Instructions (Addendum)
Please take your steroid taper first. When this is finished, if you are still not better, please take the Z-pack (azithromycin).  Please stay well hydrated, drink at least 2 liters of water a day. Warm tea with honey, lemon, ginger slices and cloves.  Neti--pot, hot steamy showers, humidifier in your room at night. Delsym for cough, flonase for nasal congestion if needed.  Call or come back if you are not better in 5-7 days.   Thank you for coming in today. I hope you feel we met your needs.  Feel free to call [facility] if you have any questions or further requests.  Please consider signing up for MyChart if you do not already have it, as this is a great way to communicate with me.  Best,  Whitney McVey, PA-C   IF you received an x-ray today, you will receive an invoice from [facility]. Please contact [facility] at [phone] with questions or concerns regarding your invoice.   IF you received labwork today, you will receive an invoice from [place]. Please contact [facility] at [phone] with questions or concerns regarding your invoice.   Our billing staff will not be able to assist you with questions regarding bills from these companies.  You will be contacted with the lab results as soon as they are available. The fastest way to get your results is to activate your My Chart account. Instructions are located on the last page of this paperwork. If you have not heard from [place] regarding the results in 2 weeks, please contact this office.

## 2016-04-13 ENCOUNTER — Encounter: Payer: BC Managed Care – PPO | Admitting: Internal Medicine

## 2016-04-21 ENCOUNTER — Other Ambulatory Visit: Payer: Self-pay | Admitting: Internal Medicine

## 2016-05-15 ENCOUNTER — Other Ambulatory Visit (INDEPENDENT_AMBULATORY_CARE_PROVIDER_SITE_OTHER): Payer: BC Managed Care – PPO

## 2016-05-15 ENCOUNTER — Ambulatory Visit (INDEPENDENT_AMBULATORY_CARE_PROVIDER_SITE_OTHER): Payer: BC Managed Care – PPO | Admitting: Internal Medicine

## 2016-05-15 ENCOUNTER — Encounter: Payer: Self-pay | Admitting: Internal Medicine

## 2016-05-15 VITALS — BP 116/86 | HR 94 | Temp 98.1°F | Resp 16 | Ht 64.0 in | Wt 155.0 lb

## 2016-05-15 DIAGNOSIS — M542 Cervicalgia: Secondary | ICD-10-CM | POA: Diagnosis not present

## 2016-05-15 DIAGNOSIS — Z0000 Encounter for general adult medical examination without abnormal findings: Secondary | ICD-10-CM | POA: Diagnosis not present

## 2016-05-15 DIAGNOSIS — M8580 Other specified disorders of bone density and structure, unspecified site: Secondary | ICD-10-CM | POA: Diagnosis not present

## 2016-05-15 DIAGNOSIS — G8929 Other chronic pain: Secondary | ICD-10-CM | POA: Insufficient documentation

## 2016-05-15 DIAGNOSIS — Z23 Encounter for immunization: Secondary | ICD-10-CM | POA: Diagnosis not present

## 2016-05-15 DIAGNOSIS — E7800 Pure hypercholesterolemia, unspecified: Secondary | ICD-10-CM

## 2016-05-15 DIAGNOSIS — I10 Essential (primary) hypertension: Secondary | ICD-10-CM

## 2016-05-15 LAB — COMPREHENSIVE METABOLIC PANEL
ALT: 14 U/L (ref 0–35)
AST: 16 U/L (ref 0–37)
Albumin: 4.4 g/dL (ref 3.5–5.2)
Alkaline Phosphatase: 84 U/L (ref 39–117)
BILIRUBIN TOTAL: 0.5 mg/dL (ref 0.2–1.2)
BUN: 21 mg/dL (ref 6–23)
CALCIUM: 9.7 mg/dL (ref 8.4–10.5)
CHLORIDE: 103 meq/L (ref 96–112)
CO2: 30 meq/L (ref 19–32)
Creatinine, Ser: 1.31 mg/dL — ABNORMAL HIGH (ref 0.40–1.20)
GFR: 43.28 mL/min — AB (ref >60.00)
Glucose, Bld: 110 mg/dL — ABNORMAL HIGH (ref 70–99)
POTASSIUM: 4.1 meq/L (ref 3.5–5.1)
Sodium: 138 mEq/L (ref 135–145)
Total Protein: 7.4 g/dL (ref 6.0–8.3)

## 2016-05-15 LAB — VITAMIN D 25 HYDROXY (VIT D DEFICIENCY, FRACTURES): VITD: 20.85 ng/mL — ABNORMAL LOW (ref 30.00–100.00)

## 2016-05-15 LAB — CBC WITH DIFFERENTIAL/PLATELET
BASOS PCT: 0.6 % (ref 0.0–3.0)
Basophils Absolute: 0 10*3/uL (ref 0.0–0.1)
EOS PCT: 1.4 % (ref 0.0–5.0)
Eosinophils Absolute: 0.1 10*3/uL (ref 0.0–0.7)
HEMATOCRIT: 38.4 % (ref 36.0–46.0)
Hemoglobin: 13.1 g/dL (ref 12.0–15.0)
LYMPHS ABS: 2.5 10*3/uL (ref 0.7–4.0)
LYMPHS PCT: 31.6 % (ref 12.0–46.0)
MCHC: 34.3 g/dL (ref 30.0–36.0)
MCV: 92.3 fl (ref 78.0–100.0)
MONOS PCT: 6.5 % (ref 3.0–12.0)
Monocytes Absolute: 0.5 10*3/uL (ref 0.1–1.0)
NEUTROS ABS: 4.8 10*3/uL (ref 1.4–7.7)
Neutrophils Relative %: 59.9 % (ref 43.0–77.0)
PLATELETS: 294 10*3/uL (ref 150.0–400.0)
RBC: 4.16 Mil/uL (ref 3.87–5.11)
RDW: 13.3 % (ref 11.5–15.5)
WBC: 7.9 10*3/uL (ref 4.0–10.5)

## 2016-05-15 LAB — LIPID PANEL
CHOLESTEROL: 223 mg/dL — AB (ref 0–200)
HDL: 75.4 mg/dL (ref >39.00)
LDL CALC: 122 mg/dL — AB (ref 0–99)
NonHDL: 147.39
TRIGLYCERIDES: 128 mg/dL (ref 0.0–149.0)
Total CHOL/HDL Ratio: 3
VLDL: 25.6 mg/dL (ref 0.0–40.0)

## 2016-05-15 LAB — HEMOGLOBIN A1C: Hgb A1c MFr Bld: 5.7 % (ref 4.6–6.5)

## 2016-05-15 NOTE — Patient Instructions (Addendum)
Test(s) ordered today. Your results will be released to [place] (or called to you) after review, usually within 72hours after test completion. If any changes need to be made, you will be notified at that same time.  All other Health Maintenance issues reviewed.   All recommended immunizations and age-appropriate screenings are up-to-date or discussed.  Prevnar immunization administered today.   Medications reviewed and updated.  No changes recommended at this time.  Your prescription(s) have been submitted to your pharmacy. Please take as directed and contact our office if you believe you are having problem(s) with the medication(s).   Please followup in one year, sooner if needed   Health Maintenance, Female Introduction Adopting a healthy lifestyle and getting preventive care can go a long way to promote health and wellness. Talk with your health care provider about what schedule of regular examinations is right for you. This is a good chance for you to check in with your provider about disease prevention and staying healthy. In between checkups, there are plenty of things you can do on your own. Experts have done a lot of research about which lifestyle changes and preventive measures are most likely to keep you healthy. Ask your health care provider for more information. Weight and diet Eat a healthy diet  Be sure to include plenty of vegetables, fruits, low-fat dairy products, and lean protein.  Do not eat a lot of foods high in solid fats, added sugars, or salt.  Get regular exercise. This is one of the most important things you can do for your health.  Most adults should exercise for at least 150 minutes each week. The exercise should increase your heart rate and make you sweat (moderate-intensity exercise).  Most adults should also do strengthening exercises at least twice a week. This is in addition to the moderate-intensity exercise. Maintain a healthy weight  Body mass  index (BMI) is a measurement that can be used to identify possible weight problems. It estimates body fat based on height and weight. Your health care provider can help determine your BMI and help you achieve or maintain a healthy weight.  For females 97 years of age and older:  A BMI below 18.5 is considered underweight.  A BMI of 18.5 to 24.9 is normal.  A BMI of 25 to 29.9 is considered overweight.  A BMI of 30 and above is considered obese. Watch levels of cholesterol and blood lipids  You should start having your blood tested for lipids and cholesterol at 66 years of age, then have this test every 5 years.  You may need to have your cholesterol levels checked more often if:  Your lipid or cholesterol levels are high.  You are older than 66 years of age.  You are at high risk for heart disease. Cancer screening Lung Cancer  Lung cancer screening is recommended for adults 10-58 years old who are at high risk for lung cancer because of a history of smoking.  A yearly low-dose CT scan of the lungs is recommended for people who:  Currently smoke.  Have quit within the past 15 years.  Have at least a 30-pack-year history of smoking. A pack year is smoking an average of one pack of cigarettes a day for 1 year.  Yearly screening should continue until it has been 15 years since you quit.  Yearly screening should stop if you develop a health problem that would prevent you from having lung cancer treatment. Breast Cancer  Practice breast self-awareness.  This means understanding how your breasts normally appear and feel.  It also means doing regular breast self-exams. Let your health care provider know about any changes, no matter how small.  If you are in your 20s or 30s, you should have a clinical breast exam (CBE) by a health care provider every 1-3 years as part of a regular health exam.  If you are 18 or older, have a CBE every year. Also consider having a breast X-ray  (mammogram) every year.  If you have a family history of breast cancer, talk to your health care provider about genetic screening.  If you are at high risk for breast cancer, talk to your health care provider about having an MRI and a mammogram every year.  Breast cancer gene (BRCA) assessment is recommended for women who have family members with BRCA-related cancers. BRCA-related cancers include:  Breast.  Ovarian.  Tubal.  Peritoneal cancers.  Results of the assessment will determine the need for genetic counseling and BRCA1 and BRCA2 testing. Cervical Cancer  Your health care provider may recommend that you be screened regularly for cancer of the pelvic organs (ovaries, uterus, and vagina). This screening involves a pelvic examination, including checking for microscopic changes to the surface of your cervix (Pap test). You may be encouraged to have this screening done every 3 years, beginning at age 58.  For women ages 45-65, health care providers may recommend pelvic exams and Pap testing every 3 years, or they may recommend the Pap and pelvic exam, combined with testing for human papilloma virus (HPV), every 5 years. Some types of HPV increase your risk of cervical cancer. Testing for HPV may also be done on women of any age with unclear Pap test results.  Other health care providers may not recommend any screening for nonpregnant women who are considered low risk for pelvic cancer and who do not have symptoms. Ask your health care provider if a screening pelvic exam is right for you.  If you have had past treatment for cervical cancer or a condition that could lead to cancer, you need Pap tests and screening for cancer for at least 20 years after your treatment. If Pap tests have been discontinued, your risk factors (such as having a new sexual partner) need to be reassessed to determine if screening should resume. Some women have medical problems that increase the chance of getting  cervical cancer. In these cases, your health care provider may recommend more frequent screening and Pap tests. Colorectal Cancer  This type of cancer can be detected and often prevented.  Routine colorectal cancer screening usually begins at 66 years of age and continues through 66 years of age.  Your health care provider may recommend screening at an earlier age if you have risk factors for colon cancer.  Your health care provider may also recommend using home test kits to check for hidden blood in the stool.  A small camera at the end of a tube can be used to examine your colon directly (sigmoidoscopy or colonoscopy). This is done to check for the earliest forms of colorectal cancer.  Routine screening usually begins at age 34.  Direct examination of the colon should be repeated every 5-10 years through 66 years of age. However, you may need to be screened more often if early forms of precancerous polyps or small growths are found. Skin Cancer  Check your skin from head to toe regularly.  Tell your health care provider about any new moles  or changes in moles, especially if there is a change in a mole's shape or color.  Also tell your health care provider if you have a mole that is larger than the size of a pencil eraser.  Always use sunscreen. Apply sunscreen liberally and repeatedly throughout the day.  Protect yourself by wearing long sleeves, pants, a wide-brimmed hat, and sunglasses whenever you are outside. Heart disease, diabetes, and high blood pressure  High blood pressure causes heart disease and increases the risk of stroke. High blood pressure is more likely to develop in:  People who have blood pressure in the high end of the normal range (130-139/85-89 mm Hg).  People who are overweight or obese.  People who are African American.  If you are 44-45 years of age, have your blood pressure checked every 3-5 years. If you are 36 years of age or older, have your blood  pressure checked every year. You should have your blood pressure measured twice-once when you are at a hospital or clinic, and once when you are not at a hospital or clinic. Record the average of the two measurements. To check your blood pressure when you are not at a hospital or clinic, you can use:  An automated blood pressure machine at a pharmacy.  A home blood pressure monitor.  If you are between 59 years and 23 years old, ask your health care provider if you should take aspirin to prevent strokes.  Have regular diabetes screenings. This involves taking a blood sample to check your fasting blood sugar level.  If you are at a normal weight and have a low risk for diabetes, have this test once every three years after 66 years of age.  If you are overweight and have a high risk for diabetes, consider being tested at a younger age or more often. Preventing infection Hepatitis B  If you have a higher risk for hepatitis B, you should be screened for this virus. You are considered at high risk for hepatitis B if:  You were born in a country where hepatitis B is common. Ask your health care provider which countries are considered high risk.  Your parents were born in a high-risk country, and you have not been immunized against hepatitis B (hepatitis B vaccine).  You have HIV or AIDS.  You use needles to inject street drugs.  You live with someone who has hepatitis B.  You have had sex with someone who has hepatitis B.  You get hemodialysis treatment.  You take certain medicines for conditions, including cancer, organ transplantation, and autoimmune conditions. Hepatitis C  Blood testing is recommended for:  Everyone born from [birth year] through 1965.  Anyone with known risk factors for hepatitis C. Sexually transmitted infections (STIs)  You should be screened for sexually transmitted infections (STIs) including gonorrhea and chlamydia if:  You are sexually active and are younger  than 66 years of age.  You are older than 66 years of age and your health care provider tells you that you are at risk for this type of infection.  Your sexual activity has changed since you were last screened and you are at an increased risk for chlamydia or gonorrhea. Ask your health care provider if you are at risk.  If you do not have HIV, but are at risk, it may be recommended that you take a prescription medicine daily to prevent HIV infection. This is called pre-exposure prophylaxis (PrEP). You are considered at risk if:  You are  sexually active and do not regularly use condoms or know the HIV status of your partner(s).  You take drugs by injection.  You are sexually active with a partner who has HIV. Talk with your health care provider about whether you are at high risk of being infected with HIV. If you choose to begin PrEP, you should first be tested for HIV. You should then be tested every 3 months for as long as you are taking PrEP. Pregnancy  If you are premenopausal and you may become pregnant, ask your health care provider about preconception counseling.  If you may become pregnant, take 400 to 800 micrograms (mcg) of folic acid every day.  If you want to prevent pregnancy, talk to your health care provider about birth control (contraception). Osteoporosis and menopause  Osteoporosis is a disease in which the bones lose minerals and strength with aging. This can result in serious bone fractures. Your risk for osteoporosis can be identified using a bone density scan.  If you are 106 years of age or older, or if you are at risk for osteoporosis and fractures, ask your health care provider if you should be screened.  Ask your health care provider whether you should take a calcium or vitamin D supplement to lower your risk for osteoporosis.  Menopause may have certain physical symptoms and risks.  Hormone replacement therapy may reduce some of these symptoms and risks. Talk  to your health care provider about whether hormone replacement therapy is right for you. Follow these instructions at home:  Schedule regular health, dental, and eye exams.  Stay current with your immunizations.  Do not use any tobacco products including cigarettes, chewing tobacco, or electronic cigarettes.  If you are pregnant, do not drink alcohol.  If you are breastfeeding, limit how much and how often you drink alcohol.  Limit alcohol intake to no more than 1 drink per day for nonpregnant women. One drink equals 12 ounces of beer, 5 ounces of wine, or 1 ounces of hard liquor.  Do not use street drugs.  Do not share needles.  Ask your health care provider for help if you need support or information about quitting drugs.  Tell your health care provider if you often feel depressed.  Tell your health care provider if you have ever been abused or do not feel safe at home. This information is not intended to replace advice given to you by your health care provider. Make sure you discuss any questions you have with your health care provider. Document Released: 10/23/2010 Document Revised: 09/15/2015 Document Reviewed: 01/11/2015  2017 Elsevier

## 2016-05-15 NOTE — Assessment & Plan Note (Signed)
Chronic neck pain associated with muscle pain and stomach pain Started on amitriptyline years ago and symptoms resolved Continue current dose of amitriptyline

## 2016-05-15 NOTE — Progress Notes (Signed)
Subjective:    Patient ID: Danielle Fischer, female    DOB: [birth year], 66 y.o.   MRN: [id number]  HPI She is here for a physical exam.   She retired last month.  She is doing yoga once a week.      Chronic neck pain, stomach symptoms and muscle pain: she is taking the amitriptyline daily.  She has not had any symptoms since starting the medication.   Medications and allergies reviewed with patient and updated if appropriate.  Patient Active Problem List   Diagnosis Date Noted  . Seasonal affective disorder ([place]) 09/08/2012  . Hyperlipidemia 04/25/2011  . Uterine prolapse   . Cystocele   . Rectocele   . SUI (stress urinary incontinence, female)   . Osteopenia   . Essential hypertension 11/10/2007    Current Outpatient Prescriptions on File Prior to Visit  Medication Sig Dispense Refill  . amitriptyline (ELAVIL) 25 MG tablet TAKE 1 TABLET BY MOUTH EVERY NIGHT AT BEDTIME 90 tablet 0  . Calcium Carbonate-Vitamin D (CALCIUM + D PO) Take by mouth 2 (two) times daily.      . metoprolol succinate (TOPROL-XL) 50 MG 24 hr tablet Take 1 tablet (50 mg total) by mouth daily. Take with or immediately following a meal. 90 tablet 3   No current facility-administered medications on file prior to visit.     Past Medical History:  Diagnosis Date  . Cystocele   . Hypertension   . Osteopenia   . Rectocele   . SUI (stress urinary incontinence, female)   . Uterine prolapse     Past Surgical History:  Procedure Laterality Date  . COLONOSCOPY  2009   negative; Dr Michaelyn Barter  . VAGINAL HYSTERECTOMY  1993   A/P Repair    Social History   Social History  . Marital status: Married    Spouse name: N/A  . Number of children: N/A  . Years of education: N/A   Social History Main Topics  . Smoking status: Former Research scientist (life sciences)  . Smokeless tobacco: Not on file     Comment: smoked ages [age], up to 5 cigarettes/day  . Alcohol use 2.4 oz/week    4 Glasses of wine per week   Comment:  socially on weekends  . Drug use: No  . Sexual activity: Yes    Birth control/ protection: Surgical   Other Topics Concern  . Not on file   Social History Narrative   No regular exercise    Family History  Problem Relation Age of Onset  . Hypertension Mother   . Osteoporosis Mother   . Hypertension Brother   . Prostate cancer Brother   . Heart disease Father     MI @ 33  . ALS Father   . Breast cancer Paternal Aunt   . Osteoporosis Maternal Grandmother   . Stroke Paternal Grandmother   . Diabetes Neg Hx     Review of Systems  Constitutional: Negative for chills and fever.  Eyes: Negative for visual disturbance.  Respiratory: Negative for cough, shortness of breath and wheezing.   Cardiovascular: Positive for palpitations (rare). Negative for chest pain and leg swelling.  Gastrointestinal: Negative for abdominal pain, blood in stool, constipation, diarrhea and nausea.       No gerd  Genitourinary: Negative for dysuria and hematuria.  Musculoskeletal: Negative for arthralgias and back pain.  Skin: Negative for color change and rash.  Neurological: Negative for dizziness, light-headedness and headaches.  Psychiatric/Behavioral: Negative for dysphoric  mood. The patient is not nervous/anxious.        Objective:   Vitals:   05/15/16 1423  BP: 116/86  Pulse: 94  Resp: 16  Temp: 98.1 F (36.7 C)   Filed Weights   05/15/16 1423  Weight: 155 lb (70.3 kg)   Body mass index is 26.61 kg/m.  Wt Readings from Last 3 Encounters:  05/15/16 155 lb (70.3 kg)  04/12/16 155 lb (70.3 kg)  04/04/15 150 lb (68 kg)     Physical Exam Constitutional: She appears well-developed and well-nourished. No distress.  HENT:  Head: Normocephalic and atraumatic.  Right Ear: External ear normal. Normal ear canal and TM Left Ear: External ear normal.  Normal ear canal and TM Mouth/Throat: Oropharynx is clear and moist.  Eyes: Conjunctivae and EOM are normal.  Neck: Neck  supple. No tracheal deviation present. No thyromegaly present.  No carotid bruit  Cardiovascular: Normal rate, regular rhythm and normal heart sounds.   No murmur heard.  No edema. Pulmonary/Chest: Effort normal and breath sounds normal. No respiratory distress. She has no wheezes. She has no rales.  Breast: deferred to Gyn Abdominal: Soft. She exhibits no distension. There is no tenderness.  Lymphadenopathy: She has no cervical adenopathy.  Skin: Skin is warm and dry. She is not diaphoretic.  Psychiatric: She has a normal mood and affect. Her behavior is normal.         Assessment & Plan:   Physical exam: Screening blood work ordered Immunizations prevnar today, discussed other vaccines -  Colonoscopy  Up to date  Mammogram  Up to date  Gyn -  Not up to date - will schedule Dexa - due - has ben done at gyn's office - will schedule Eye exams  Up to date  Exercise - starting to exercise more regularly Weight - working of weight loss Skin  No concerns Substance abuse   - none  See Problem List for Assessment and Plan of chronic medical problems.  FU annually

## 2016-05-15 NOTE — Progress Notes (Signed)
Pre visit review using our clinic review tool, if applicable. No additional management support is needed unless otherwise documented below in the visit note. 

## 2016-05-15 NOTE — Assessment & Plan Note (Signed)
Check lipid panel  Regular exercise and healthy diet encouraged  

## 2016-05-15 NOTE — Assessment & Plan Note (Signed)
dexa done by gyn in the past She is due for gyn visit and dexa - she will schedule Does not tolerate calcium - will increase dietary calcium - try different supplements Will check vitamin d level

## 2016-05-15 NOTE — Assessment & Plan Note (Signed)
BP well controlled Current regimen effective and well tolerated Continue current medications at current doses  

## 2016-05-16 NOTE — Addendum Note (Signed)
Addended by: Terence Lux B on: 05/16/2016 09:20 AM   Modules accepted: Orders

## 2016-07-17 ENCOUNTER — Other Ambulatory Visit: Payer: Self-pay | Admitting: Internal Medicine

## 2016-07-21 ENCOUNTER — Other Ambulatory Visit: Payer: Self-pay | Admitting: Internal Medicine

## 2017-02-01 LAB — HM MAMMOGRAPHY

## 2017-02-06 ENCOUNTER — Encounter: Payer: Self-pay | Admitting: Internal Medicine

## 2017-02-20 ENCOUNTER — Telehealth: Payer: Self-pay | Admitting: Internal Medicine

## 2017-02-20 NOTE — Telephone Encounter (Signed)
Patient got flu shot today at [facility] at [place].

## 2017-02-21 ENCOUNTER — Ambulatory Visit: Payer: BC Managed Care – PPO

## 2017-02-21 NOTE — Telephone Encounter (Signed)
Documented in chart.

## 2017-04-20 ENCOUNTER — Other Ambulatory Visit: Payer: Self-pay | Admitting: Internal Medicine

## 2017-07-15 ENCOUNTER — Telehealth: Payer: Self-pay | Admitting: Internal Medicine

## 2017-07-15 NOTE — Telephone Encounter (Signed)
Copied from [place] [id number]. Topic: Quick Communication - Rx Refill/Question >> Jul 15, 2017  1:07 PM Ether Griffins B wrote: Medication: metoprolol succinate (TOPROL-XL) 100 MG 24 hr tablet   Pt has cpe scheduled on 4/04/23/17   Has the patient contacted their pharmacy? Yes.   (Agent: If no, request that the patient contact the pharmacy for the refill.)  Preferred Pharmacy (with phone number or street name): [id number] - [place], [address]: Please be advised that RX refills may take up to 3 business days. We ask that you follow-up with your pharmacy.

## 2017-07-16 ENCOUNTER — Other Ambulatory Visit: Payer: Self-pay | Admitting: Internal Medicine

## 2017-07-16 MED ORDER — METOPROLOL SUCCINATE ER 100 MG PO TB24: 100.0000 mg | ORAL_TABLET | Freq: Every day | ORAL | 0 refills | Status: DC

## 2017-07-19 ENCOUNTER — Encounter: Payer: Self-pay | Admitting: Internal Medicine

## 2017-07-19 DIAGNOSIS — R7303 Prediabetes: Secondary | ICD-10-CM | POA: Insufficient documentation

## 2017-07-19 NOTE — Patient Instructions (Addendum)
Test(s) ordered today. Your results will be released to [place] (or called to you) after review, usually within 72hours after test completion. If any changes need to be made, you will be notified at that same time.  All other Health Maintenance issues reviewed.   All recommended immunizations and age-appropriate screenings are up-to-date or discussed.  Pneumovax immunization administered today.   Medications reviewed and updated.  No changes recommended at this time.   Please followup in one year   Health Maintenance, Female Adopting a healthy lifestyle and getting preventive care can go a long way to promote health and wellness. Talk with your health care provider about what schedule of regular examinations is right for you. This is a good chance for you to check in with your provider about disease prevention and staying healthy. In between checkups, there are plenty of things you can do on your own. Experts have done a lot of research about which lifestyle changes and preventive measures are most likely to keep you healthy. Ask your health care provider for more information. Weight and diet Eat a healthy diet  Be sure to include plenty of vegetables, fruits, low-fat dairy products, and lean protein.  Do not eat a lot of foods high in solid fats, added sugars, or salt.  Get regular exercise. This is one of the most important things you can do for your health. ? Most adults should exercise for at least 150 minutes each week. The exercise should increase your heart rate and make you sweat (moderate-intensity exercise). ? Most adults should also do strengthening exercises at least twice a week. This is in addition to the moderate-intensity exercise.  Maintain a healthy weight  Body mass index (BMI) is a measurement that can be used to identify possible weight problems. It estimates body fat based on height and weight. Your health care provider can help determine your BMI and help you  achieve or maintain a healthy weight.  For females 14 years of age and older: ? A BMI below 18.5 is considered underweight. ? A BMI of 18.5 to 24.9 is normal. ? A BMI of 25 to 29.9 is considered overweight. ? A BMI of 30 and above is considered obese.  Watch levels of cholesterol and blood lipids  You should start having your blood tested for lipids and cholesterol at 67 years of age, then have this test every 5 years.  You may need to have your cholesterol levels checked more often if: ? Your lipid or cholesterol levels are high. ? You are older than 67 years of age. ? You are at high risk for heart disease.  Cancer screening Lung Cancer  Lung cancer screening is recommended for adults 87-3 years old who are at high risk for lung cancer because of a history of smoking.  A yearly low-dose CT scan of the lungs is recommended for people who: ? Currently smoke. ? Have quit within the past 15 years. ? Have at least a 30-pack-year history of smoking. A pack year is smoking an average of one pack of cigarettes a day for 1 year.  Yearly screening should continue until it has been 15 years since you quit.  Yearly screening should stop if you develop a health problem that would prevent you from having lung cancer treatment.  Breast Cancer  Practice breast self-awareness. This means understanding how your breasts normally appear and feel.  It also means doing regular breast self-exams. Let your health care provider know about any changes,  no matter how small.  If you are in your 20s or 30s, you should have a clinical breast exam (CBE) by a health care provider every 1-3 years as part of a regular health exam.  If you are 66 or older, have a CBE every year. Also consider having a breast X-ray (mammogram) every year.  If you have a family history of breast cancer, talk to your health care provider about genetic screening.  If you are at high risk for breast cancer, talk to your health  care provider about having an MRI and a mammogram every year.  Breast cancer gene (BRCA) assessment is recommended for women who have family members with BRCA-related cancers. BRCA-related cancers include: ? Breast. ? Ovarian. ? Tubal. ? Peritoneal cancers.  Results of the assessment will determine the need for genetic counseling and BRCA1 and BRCA2 testing.  Cervical Cancer Your health care provider may recommend that you be screened regularly for cancer of the pelvic organs (ovaries, uterus, and vagina). This screening involves a pelvic examination, including checking for microscopic changes to the surface of your cervix (Pap test). You may be encouraged to have this screening done every 3 years, beginning at age 32.  For women ages 45-65, health care providers may recommend pelvic exams and Pap testing every 3 years, or they may recommend the Pap and pelvic exam, combined with testing for human papilloma virus (HPV), every 5 years. Some types of HPV increase your risk of cervical cancer. Testing for HPV may also be done on women of any age with unclear Pap test results.  Other health care providers may not recommend any screening for nonpregnant women who are considered low risk for pelvic cancer and who do not have symptoms. Ask your health care provider if a screening pelvic exam is right for you.  If you have had past treatment for cervical cancer or a condition that could lead to cancer, you need Pap tests and screening for cancer for at least 20 years after your treatment. If Pap tests have been discontinued, your risk factors (such as having a new sexual partner) need to be reassessed to determine if screening should resume. Some women have medical problems that increase the chance of getting cervical cancer. In these cases, your health care provider may recommend more frequent screening and Pap tests.  Colorectal Cancer  This type of cancer can be detected and often  prevented.  Routine colorectal cancer screening usually begins at 67 years of age and continues through 67 years of age.  Your health care provider may recommend screening at an earlier age if you have risk factors for colon cancer.  Your health care provider may also recommend using home test kits to check for hidden blood in the stool.  A small camera at the end of a tube can be used to examine your colon directly (sigmoidoscopy or colonoscopy). This is done to check for the earliest forms of colorectal cancer.  Routine screening usually begins at age 60.  Direct examination of the colon should be repeated every 5-10 years through 68 years of age. However, you may need to be screened more often if early forms of precancerous polyps or small growths are found.  Skin Cancer  Check your skin from head to toe regularly.  Tell your health care provider about any new moles or changes in moles, especially if there is a change in a mole's shape or color.  Also tell your health care provider if you  have a mole that is larger than the size of a pencil eraser.  Always use sunscreen. Apply sunscreen liberally and repeatedly throughout the day.  Protect yourself by wearing long sleeves, pants, a wide-brimmed hat, and sunglasses whenever you are outside.  Heart disease, diabetes, and high blood pressure  High blood pressure causes heart disease and increases the risk of stroke. High blood pressure is more likely to develop in: ? People who have blood pressure in the high end of the normal range (130-139/85-89 mm Hg). ? People who are overweight or obese. ? People who are African American.  If you are 18-39 years of age, have your blood pressure checked every 3-5 years. If you are 40 years of age or older, have your blood pressure checked every year. You should have your blood pressure measured twice-once when you are at a hospital or clinic, and once when you are not at a hospital or clinic.  Record the average of the two measurements. To check your blood pressure when you are not at a hospital or clinic, you can use: ? An automated blood pressure machine at a pharmacy. ? A home blood pressure monitor.  If you are between 55 years and 79 years old, ask your health care provider if you should take aspirin to prevent strokes.  Have regular diabetes screenings. This involves taking a blood sample to check your fasting blood sugar level. ? If you are at a normal weight and have a low risk for diabetes, have this test once every three years after 67 years of age. ? If you are overweight and have a high risk for diabetes, consider being tested at a younger age or more often. Preventing infection Hepatitis B  If you have a higher risk for hepatitis B, you should be screened for this virus. You are considered at high risk for hepatitis B if: ? You were born in a country where hepatitis B is common. Ask your health care provider which countries are considered high risk. ? Your parents were born in a high-risk country, and you have not been immunized against hepatitis B (hepatitis B vaccine). ? You have HIV or AIDS. ? You use needles to inject street drugs. ? You live with someone who has hepatitis B. ? You have had sex with someone who has hepatitis B. ? You get hemodialysis treatment. ? You take certain medicines for conditions, including cancer, organ transplantation, and autoimmune conditions.  Hepatitis C  Blood testing is recommended for: ? Everyone born from 1945 through 1965. ? Anyone with known risk factors for hepatitis C.  Sexually transmitted infections (STIs)  You should be screened for sexually transmitted infections (STIs) including gonorrhea and chlamydia if: ? You are sexually active and are younger than 67 years of age. ? You are older than 67 years of age and your health care provider tells you that you are at risk for this type of infection. ? Your sexual  activity has changed since you were last screened and you are at an increased risk for chlamydia or gonorrhea. Ask your health care provider if you are at risk.  If you do not have HIV, but are at risk, it may be recommended that you take a prescription medicine daily to prevent HIV infection. This is called pre-exposure prophylaxis (PrEP). You are considered at risk if: ? You are sexually active and do not regularly use condoms or know the HIV status of your partner(s). ? You take drugs by injection. ?   You are sexually active with a partner who has HIV.  Talk with your health care provider about whether you are at high risk of being infected with HIV. If you choose to begin PrEP, you should first be tested for HIV. You should then be tested every 3 months for as long as you are taking PrEP. Pregnancy  If you are premenopausal and you may become pregnant, ask your health care provider about preconception counseling.  If you may become pregnant, take 400 to 800 micrograms (mcg) of folic acid every day.  If you want to prevent pregnancy, talk to your health care provider about birth control (contraception). Osteoporosis and menopause  Osteoporosis is a disease in which the bones lose minerals and strength with aging. This can result in serious bone fractures. Your risk for osteoporosis can be identified using a bone density scan.  If you are 65 years of age or older, or if you are at risk for osteoporosis and fractures, ask your health care provider if you should be screened.  Ask your health care provider whether you should take a calcium or vitamin D supplement to lower your risk for osteoporosis.  Menopause may have certain physical symptoms and risks.  Hormone replacement therapy may reduce some of these symptoms and risks. Talk to your health care provider about whether hormone replacement therapy is right for you. Follow these instructions at home:  Schedule regular health, dental,  and eye exams.  Stay current with your immunizations.  Do not use any tobacco products including cigarettes, chewing tobacco, or electronic cigarettes.  If you are pregnant, do not drink alcohol.  If you are breastfeeding, limit how much and how often you drink alcohol.  Limit alcohol intake to no more than 1 drink per day for nonpregnant women. One drink equals 12 ounces of beer, 5 ounces of wine, or 1 ounces of hard liquor.  Do not use street drugs.  Do not share needles.  Ask your health care provider for help if you need support or information about quitting drugs.  Tell your health care provider if you often feel depressed.  Tell your health care provider if you have ever been abused or do not feel safe at home. This information is not intended to replace advice given to you by your health care provider. Make sure you discuss any questions you have with your health care provider. Document Released: 10/23/2010 Document Revised: 09/15/2015 Document Reviewed: 01/11/2015 Elsevier Interactive Patient Education  2018 [employer] Inc[employer]  

## 2017-07-19 NOTE — Progress Notes (Signed)
Subjective:    Patient ID: Danielle Fischer, female    DOB: 03/02/1951, 67 y.o.   MRN: [id number]  HPI She is here for a physical exam.   Kidney function:  She has decreased kidney function the past few times she has had labs done. She takes one excedrin about once a week.  She takes advil on occasion.  She is trying to increase water intake - she could do better.    She has no other concerns.  She denies any changes in her health since she was here last.  Medications and allergies reviewed with patient and updated if appropriate.  Patient Active Problem List   Diagnosis Date Noted  . Prediabetes 07/19/2017  . Chronic neck pain 05/15/2016  . Seasonal affective disorder ([place]) 09/08/2012  . Hyperlipidemia 04/25/2011  . Uterine prolapse   . Cystocele   . Rectocele   . SUI (stress urinary incontinence, female)   . Osteopenia   . Essential hypertension 11/10/2007    Current Outpatient Medications on File Prior to Visit  Medication Sig Dispense Refill  . amitriptyline (ELAVIL) 25 MG tablet Take 1 tablet (25 mg total) by mouth at bedtime. --- Office visit needed for further refills (Patient taking differently: Take 25 mg by mouth at bedtime. ) 90 tablet 0  . Calcium Carbonate-Vitamin D (CALCIUM + D PO) Take by mouth 2 (two) times daily.      . metoprolol succinate (TOPROL-XL) 100 MG 24 hr tablet Take 50 mg by mouth daily. Take with or immediately following a meal.     No current facility-administered medications on file prior to visit.     Past Medical History:  Diagnosis Date  . Cystocele   . Hypertension   . Osteopenia   . Rectocele   . SUI (stress urinary incontinence, female)   . Uterine prolapse     Past Surgical History:  Procedure Laterality Date  . COLONOSCOPY  2009   negative; Dr Michaelyn Barter  . VAGINAL HYSTERECTOMY  1993   A/P Repair    Social History   Socioeconomic History  . Marital status: Married    Spouse name: Not on file  . Number of  children: Not on file  . Years of education: Not on file  . Highest education level: Not on file  Occupational History  . Not on file  Social Needs  . Financial resource strain: Not on file  . Food insecurity:    Worry: Not on file    Inability: Not on file  . Transportation needs:    Medical: Not on file    Non-medical: Not on file  Tobacco Use  . Smoking status: Former Research scientist (life sciences)  . Smokeless tobacco: Never Used  . Tobacco comment: smoked ages [age], up to 5 cigarettes/day  Substance and Sexual Activity  . Alcohol use: Yes    Alcohol/week: 2.4 oz    Types: 4 Glasses of wine per week    Comment:  socially on weekends  . Drug use: No  . Sexual activity: Yes    Birth control/protection: Surgical  Lifestyle  . Physical activity:    Days per week: Not on file    Minutes per session: Not on file  . Stress: Not on file  Relationships  . Social connections:    Talks on phone: Not on file    Gets together: Not on file    Attends religious service: Not on file    Active member of club or organization:  Not on file    Attends meetings of clubs or organizations: Not on file    Relationship status: Not on file  Other Topics Concern  . Not on file  Social History Narrative   No regular exercise    Family History  Problem Relation Age of Onset  . Hypertension Mother   . Osteoporosis Mother   . Heart disease Father        MI @ 82  . ALS Father   . Hypertension Brother   . Prostate cancer Brother   . Breast cancer Paternal Aunt   . Osteoporosis Maternal Grandmother   . Stroke Paternal Grandmother   . Diabetes Neg Hx     Review of Systems  Constitutional: Negative for chills and fever.  Eyes: Negative for visual disturbance.  Respiratory: Negative for cough, shortness of breath and wheezing.   Cardiovascular: Negative for chest pain, palpitations and leg swelling.  Gastrointestinal: Negative for abdominal pain, blood in stool, constipation, diarrhea and nausea.       No  gerd  Genitourinary: Negative for dysuria and hematuria.  Musculoskeletal: Negative for arthralgias and back pain.  Skin: Negative for color change and rash.  Neurological: Negative for dizziness, light-headedness and headaches.  Psychiatric/Behavioral: Negative for dysphoric mood. The patient is not nervous/anxious.        Objective:   Vitals:   07/22/17 0806  BP: 114/86  Pulse: 74  Resp: 16  Temp: 97.9 F (36.6 C)  SpO2: 98%   Filed Weights   07/22/17 0806  Weight: 156 lb (70.8 kg)   Body mass index is 26.78 kg/m.  Wt Readings from Last 3 Encounters:  07/22/17 156 lb (70.8 kg)  05/15/16 155 lb (70.3 kg)  04/12/16 155 lb (70.3 kg)     Physical Exam Constitutional: She appears well-developed and well-nourished. No distress.  HENT:  Head: Normocephalic and atraumatic.  Right Ear: External ear normal. Normal ear canal and TM Left Ear: External ear normal.  Normal ear canal and TM Mouth/Throat: Oropharynx is clear and moist.  Eyes: Conjunctivae and EOM are normal.  Neck: Neck supple. No tracheal deviation present. No thyromegaly present.  No carotid bruit  Cardiovascular: Normal rate, regular rhythm and normal heart sounds.   No murmur heard.  No edema. Pulmonary/Chest: Effort normal and breath sounds normal. No respiratory distress. She has no wheezes. She has no rales.  Breast: deferred  Abdominal: Soft. She exhibits no distension. There is no tenderness.  Lymphadenopathy: She has no cervical adenopathy.  Skin: Skin is warm and dry. She is not diaphoretic.  Psychiatric: She has a normal mood and affect. Her behavior is normal.        Assessment & Plan:   Physical exam: Screening blood work   ordered Immunizations Pneumovax today, discussed new shingles vaccine and tetanus Colonoscopy  Up to date  Mammogram   Up to date  Gyn-needs to establish with new gynecologist-she will do that Dexa-discussed having another bone density-she will consider having that  done. Eye exams   Up to date  EKG      Done 01/2014 Exercise  Not regular, does yoga regularly, will do more exercise more-stressed the importance of regular exercise Weight -weight overall good for age Skin   no concerns Substance abuse    none  See Problem List for Assessment and Plan of chronic medical problems.    Follow-up annually

## 2017-07-22 ENCOUNTER — Encounter: Payer: Self-pay | Admitting: Internal Medicine

## 2017-07-22 ENCOUNTER — Ambulatory Visit (INDEPENDENT_AMBULATORY_CARE_PROVIDER_SITE_OTHER): Payer: Medicare Other | Admitting: Internal Medicine

## 2017-07-22 ENCOUNTER — Other Ambulatory Visit (INDEPENDENT_AMBULATORY_CARE_PROVIDER_SITE_OTHER): Payer: Medicare Other

## 2017-07-22 VITALS — BP 114/86 | HR 74 | Temp 97.9°F | Resp 16 | Ht 64.0 in | Wt 156.0 lb

## 2017-07-22 DIAGNOSIS — M8580 Other specified disorders of bone density and structure, unspecified site: Secondary | ICD-10-CM | POA: Diagnosis not present

## 2017-07-22 DIAGNOSIS — G8929 Other chronic pain: Secondary | ICD-10-CM

## 2017-07-22 DIAGNOSIS — I10 Essential (primary) hypertension: Secondary | ICD-10-CM

## 2017-07-22 DIAGNOSIS — R7303 Prediabetes: Secondary | ICD-10-CM

## 2017-07-22 DIAGNOSIS — M542 Cervicalgia: Secondary | ICD-10-CM | POA: Diagnosis not present

## 2017-07-22 DIAGNOSIS — Z0000 Encounter for general adult medical examination without abnormal findings: Secondary | ICD-10-CM | POA: Diagnosis not present

## 2017-07-22 DIAGNOSIS — Z23 Encounter for immunization: Secondary | ICD-10-CM

## 2017-07-22 LAB — HEMOGLOBIN A1C: Hgb A1c MFr Bld: 5.7 % (ref 4.6–6.5)

## 2017-07-22 LAB — CBC WITH DIFFERENTIAL/PLATELET
Basophils Absolute: 0 10*3/uL (ref 0.0–0.1)
Basophils Relative: 0.6 % (ref 0.0–3.0)
EOS PCT: 4.3 % (ref 0.0–5.0)
Eosinophils Absolute: 0.2 10*3/uL (ref 0.0–0.7)
HEMATOCRIT: 39.9 % (ref 36.0–46.0)
Hemoglobin: 13.7 g/dL (ref 12.0–15.0)
LYMPHS ABS: 2.2 10*3/uL (ref 0.7–4.0)
LYMPHS PCT: 39.9 % (ref 12.0–46.0)
MCHC: 34.3 g/dL (ref 30.0–36.0)
MCV: 95.6 fl (ref 78.0–100.0)
MONOS PCT: 8.4 % (ref 3.0–12.0)
Monocytes Absolute: 0.5 10*3/uL (ref 0.1–1.0)
NEUTROS PCT: 46.8 % (ref 43.0–77.0)
Neutro Abs: 2.6 10*3/uL (ref 1.4–7.7)
Platelets: 247 10*3/uL (ref 150.0–400.0)
RBC: 4.18 Mil/uL (ref 3.87–5.11)
RDW: 12.8 % (ref 11.5–15.5)
WBC: 5.6 10*3/uL (ref 4.0–10.5)

## 2017-07-22 LAB — COMPREHENSIVE METABOLIC PANEL
ALK PHOS: 70 U/L (ref 39–117)
ALT: 11 U/L (ref 0–35)
AST: 16 U/L (ref 0–37)
Albumin: 3.9 g/dL (ref 3.5–5.2)
BUN: 21 mg/dL (ref 6–23)
CO2: 30 meq/L (ref 19–32)
Calcium: 9.4 mg/dL (ref 8.4–10.5)
Chloride: 104 mEq/L (ref 96–112)
Creatinine, Ser: 1.25 mg/dL — ABNORMAL HIGH (ref 0.40–1.20)
GFR: 45.52 mL/min — ABNORMAL LOW (ref >60.00)
GLUCOSE: 114 mg/dL — AB (ref 70–99)
POTASSIUM: 4.7 meq/L (ref 3.5–5.1)
Sodium: 140 mEq/L (ref 135–145)
TOTAL PROTEIN: 6.9 g/dL (ref 6.0–8.3)
Total Bilirubin: 0.4 mg/dL (ref 0.2–1.2)

## 2017-07-22 LAB — LIPID PANEL
CHOL/HDL RATIO: 3
Cholesterol: 188 mg/dL (ref 0–200)
HDL: 61.2 mg/dL (ref >39.00)
LDL CALC: 96 mg/dL (ref 0–99)
NonHDL: 126.55
TRIGLYCERIDES: 153 mg/dL — AB (ref 0.0–149.0)
VLDL: 30.6 mg/dL (ref 0.0–40.0)

## 2017-07-22 LAB — TSH: TSH: 2.75 u[IU]/mL (ref 0.35–4.50)

## 2017-07-22 LAB — VITAMIN D 25 HYDROXY (VIT D DEFICIENCY, FRACTURES): VITD: 28.57 ng/mL — AB (ref 30.00–100.00)

## 2017-07-22 MED ORDER — AMITRIPTYLINE HCL 25 MG PO TABS: 25.0000 mg | ORAL_TABLET | Freq: Every day | ORAL | 3 refills | Status: DC

## 2017-07-22 NOTE — Assessment & Plan Note (Signed)
Check a1c Low sugar / carb diet Stressed regular exercise   

## 2017-07-22 NOTE — Assessment & Plan Note (Signed)
Taking amitriptyline nightly Symptoms well controlled Continue above

## 2017-07-22 NOTE — Assessment & Plan Note (Signed)
Discussed having a repeat DEXA-she would like to think about this Continue calcium and vitamin D We will check vitamin D level Stressed increasing regular exercise

## 2017-07-22 NOTE — Assessment & Plan Note (Signed)
BP well controlled Current regimen effective and well tolerated Continue current medications at current doses cmp  

## 2017-09-03 ENCOUNTER — Ambulatory Visit: Payer: Medicare Other | Admitting: Internal Medicine

## 2017-09-03 ENCOUNTER — Ambulatory Visit: Payer: Self-pay

## 2017-09-03 ENCOUNTER — Encounter: Payer: Self-pay | Admitting: Internal Medicine

## 2017-09-03 VITALS — BP 130/90 | HR 89 | Temp 97.9°F | Ht 64.0 in | Wt 149.0 lb

## 2017-09-03 DIAGNOSIS — I10 Essential (primary) hypertension: Secondary | ICD-10-CM | POA: Diagnosis not present

## 2017-09-03 NOTE — Telephone Encounter (Signed)
Noted  

## 2017-09-03 NOTE — Assessment & Plan Note (Addendum)
Suspect this is normal and possibly related to some circulation diversion to digestion. EKG done in the office without changes. Ordered treadmill stress test given the family history for reassurance. No adjustment to metoprolol dosing given normal BPs.

## 2017-09-03 NOTE — Patient Instructions (Signed)
We have done the EKG today which is normal and will get the stress test done.

## 2017-09-03 NOTE — Telephone Encounter (Signed)
Pt. Concerned about BP running on the low side after yoga class. Also some dizziness and low pulse rate at 60. Is on a beta -blocker. Appointment made for today.  Reason for Disposition . [0] Systolic BP 37-096 AND [4] taking blood pressure medications AND [3] NOT dizzy, lightheaded or weak  Answer Assessment - Initial Assessment Questions 1. BLOOD PRESSURE: "What is the blood pressure?" "Did you take at least two measurements 5 minutes apart?"     120/60  120/43  Pulse 60 2. ONSET: "When did you take your blood pressure?"     This morning after yoga - had some dizziness after class 3. HOW: "How did you obtain the blood pressure?" (e.g., visiting nurse, automatic home BP monitor)     Home BP monitor 4. HISTORY: "Do you have a history of low blood pressure?" "What is your blood pressure normally?"     High pressure medication 5. MEDICATIONS: "Are you taking any medications for blood pressure?" If yes: "Have they been changed recently?"     Yes 6. PULSE RATE: "Do you know what your pulse rate is?"      60 7. OTHER SYMPTOMS: "Have you been sick recently?" "Have you had a recent injury?"     No 8. PREGNANCY: "Is there any chance you are pregnant?" "When was your last menstrual period?"     No  Protocols used: LOW BLOOD PRESSURE-A-AH

## 2017-09-03 NOTE — Progress Notes (Signed)
   Subjective:    Patient ID: Danielle Fischer, female    DOB: 04/22/1951, 67 y.o.   MRN: [id number]  HPI The patient is a 67 YO female coming in for worries about her blood pressure. She is concerned that it is running low lately after a yoga class. She does take metoprolol 100 mg daily and denies dose change recently. She has had this happen once before. She did have a sensation of sweat along her forehead which was not typical for the yoga class. She checked BP afterwards at home and it was 120/60 then 120/43. She did feel a shadow of lightheadedness. She talked to her daughter about this and she recommended her to come in and talk to [place] about it. She had father with heart attack in 80s (heavy smoker) and lots of family history of hypertension. BP is running normal at home 130/80s. She had eaten before yoga and was hydrated.   Review of Systems  Constitutional: Negative.   HENT: Negative.   Eyes: Negative.   Respiratory: Negative for cough, chest tightness and shortness of breath.   Cardiovascular: Negative for chest pain, palpitations and leg swelling.  Gastrointestinal: Negative for abdominal distention, abdominal pain, constipation, diarrhea, nausea and vomiting.  Musculoskeletal: Negative.   Skin: Negative.   Neurological: Negative.   Psychiatric/Behavioral: Negative.       Objective:   Physical Exam  Constitutional: She is oriented to person, place, and time. She appears well-developed and well-nourished.  HENT:  Head: Normocephalic and atraumatic.  Eyes: EOM are normal.  Neck: Normal range of motion.  Cardiovascular: Normal rate and regular rhythm.  Pulmonary/Chest: Effort normal and breath sounds normal. No respiratory distress. She has no wheezes. She has no rales.  Abdominal: Soft. Bowel sounds are normal. She exhibits no distension. There is no tenderness. There is no rebound.  Musculoskeletal: She exhibits no edema.  Neurological: She is alert and oriented to person, place,  and time. Coordination normal.  Skin: Skin is warm and dry.  Psychiatric: She has a normal mood and affect.   Vitals:   09/03/17 1531  BP: 130/90  Pulse: 89  Temp: 97.9 F (36.6 C)  TempSrc: Oral  SpO2: 99%  Weight: 149 lb (67.6 kg)  Height: 5\' 4"  (1.626 m)   EKG: Rate 75, axis normal, interval normal, sinus, no st or t wave changes, no change when compared to 2015    Assessment & Plan:

## 2017-09-19 ENCOUNTER — Ambulatory Visit (INDEPENDENT_AMBULATORY_CARE_PROVIDER_SITE_OTHER): Payer: Medicare Other

## 2017-09-19 DIAGNOSIS — I10 Essential (primary) hypertension: Secondary | ICD-10-CM

## 2017-09-19 LAB — EXERCISE TOLERANCE TEST
CHL CUP MPHR: 154 {beats}/min
CSEPEW: 10.1 METS
CSEPPHR: 162 {beats}/min
Exercise duration (min): 9 min
Exercise duration (sec): 0 s
Percent HR: 105 %
RPE: 16
Rest HR: 83 {beats}/min

## 2017-11-19 ENCOUNTER — Telehealth: Payer: Self-pay | Admitting: Emergency Medicine

## 2017-11-19 NOTE — Telephone Encounter (Signed)
LVM for pt to call back and let us know if she still needs RX for fever blister.

## 2018-01-24 ENCOUNTER — Other Ambulatory Visit: Payer: Self-pay | Admitting: Internal Medicine

## 2018-02-24 ENCOUNTER — Telehealth: Payer: Self-pay | Admitting: Internal Medicine

## 2018-02-24 ENCOUNTER — Other Ambulatory Visit: Payer: Self-pay | Admitting: Internal Medicine

## 2018-02-24 MED ORDER — METOPROLOL SUCCINATE ER 50 MG PO TB24: 50.0000 mg | ORAL_TABLET | Freq: Every day | ORAL | 1 refills | Status: DC

## 2018-02-24 NOTE — Telephone Encounter (Signed)
Copied from [place] [id number]. Topic: Quick Communication - Rx Refill/Question >> Feb 24, 2018 12:08 PM Wynetta Emery, [place] C wrote: Medication: metoprolol succinate (TOPROL-XL) 100 MG 24 hr tablet pt is requesting to have 50 MG instead of having to cut her pills.   Has the patient contacted their pharmacy? Yes  (Agent: If no, request that the patient contact the pharmacy for the refill.) (Agent: If yes, when and what did the pharmacy advise?)  Preferred Pharmacy (with phone number or street name): [facility], [address] AT [facility] [address] [place] [phone] (Phone) [phone] (Fax)    Agent: Please be advised that RX refills may take up to 3 business days. We ask that you follow-up with your pharmacy.

## 2018-02-24 NOTE — Telephone Encounter (Signed)
LVM for pt to call back.

## 2018-02-24 NOTE — Telephone Encounter (Signed)
There are 2 different metoprolol's listed one of which says take 50 mg daily and the other states take 1 tablet daily. They are both 100 mg tablets but the one that says take 50 mg is not the one that typically gets filled. Is she supposed to take 50 mg or 100 mg? Just want to make sure before sending in the a 50 mg tablet.

## 2018-02-24 NOTE — Telephone Encounter (Signed)
My last note says she was taking 50 mg daily or 1/2 of the 100 mg.  May need to call her and find out.  Can switch to 50 mg tab which would be easier for all

## 2018-02-24 NOTE — Telephone Encounter (Signed)
Rx sent for metoprolol 50 mg

## 2018-02-24 NOTE — Telephone Encounter (Signed)
Pt called back.  She has been taking one half of a 100mg  tablet.  Pt wants to get the 50mg  tablet instead of cutting the 100mg  tablet in half.

## 2018-03-24 LAB — HM MAMMOGRAPHY

## 2018-04-01 ENCOUNTER — Encounter: Payer: Self-pay | Admitting: Internal Medicine

## 2018-07-24 ENCOUNTER — Other Ambulatory Visit: Payer: Self-pay | Admitting: Internal Medicine

## 2019-01-28 ENCOUNTER — Other Ambulatory Visit: Payer: Self-pay | Admitting: Internal Medicine

## 2019-01-30 ENCOUNTER — Telehealth: Payer: Self-pay | Admitting: Internal Medicine

## 2019-01-30 NOTE — Telephone Encounter (Signed)
Patient states she will call back in November currently out of town and would like refill sent to   [facility], [place] AT [place] (Phone) [phone] (Fax)   For metoprolol succinate (TOPROL-XL) 50 MG 24 hr tablet and amitriptyline (ELAVIL) 25 MG tablet

## 2019-02-03 MED ORDER — AMITRIPTYLINE HCL 25 MG PO TABS: 25.0000 mg | ORAL_TABLET | Freq: Every day | ORAL | 0 refills | Status: DC

## 2019-02-03 MED ORDER — METOPROLOL SUCCINATE ER 50 MG PO TB24: 50.0000 mg | ORAL_TABLET | Freq: Every day | ORAL | 0 refills | Status: DC

## 2019-02-03 NOTE — Addendum Note (Signed)
Addended by: Delice Bison E on: 02/03/2019 01:20 PM   Modules accepted: Orders

## 2019-02-03 NOTE — Telephone Encounter (Signed)
Patient called in stating she has set up a virtual visit, due to being out of town till November. Patient is still requesting these medications be sent in. Please advise.

## 2019-02-03 NOTE — Telephone Encounter (Signed)
Rx sent 

## 2019-02-05 DIAGNOSIS — N183 Chronic kidney disease, stage 3 unspecified: Secondary | ICD-10-CM | POA: Insufficient documentation

## 2019-02-05 DIAGNOSIS — E559 Vitamin D deficiency, unspecified: Secondary | ICD-10-CM | POA: Insufficient documentation

## 2019-02-05 NOTE — Progress Notes (Signed)
Virtual Visit via Video Note  I connected with Danielle Fischer on 02/06/19 at  8:45 AM EDT by a video enabled telemedicine application and verified that I am speaking with the correct person using two identifiers.   I discussed the limitations of evaluation and management by telemedicine and the availability of in person appointments. The patient expressed understanding and agreed to proceed.  [place] and I am in the office.    No referring provider.    History of Present Illness: The patient is here for follow up.  She is exercising regularly - paddle boarding, walking in cooler weather.    Hypertension: She is taking her medication daily. She is compliant with a low sodium diet.  She denies chest pain, palpitations, edema, shortness of breath and regular headaches. She does monitor her blood pressure at home, but not regularly.    Prediabetes:  She is not always compliant with a low sugar/carbohydrate diet.  She is exercising regularly.  CKD:  She is better at drinking water some days more than others.  She advil rarely.  Vitamin D deficiency: She is taking calcium and vitamin D daily.   Review of Systems  Constitutional: Negative for chills and fever.  Respiratory: Negative for cough, shortness of breath and wheezing.   Cardiovascular: Negative for chest pain, palpitations and leg swelling.  Neurological: Negative for dizziness and headaches.      Social History   Socioeconomic History  . Marital status: Married    Spouse name: Not on file  . Number of children: Not on file  . Years of education: Not on file  . Highest education level: Not on file  Occupational History  . Not on file  Social Needs  . Financial resource strain: Not on file  . Food insecurity    Worry: Not on file    Inability: Not on file  . Transportation needs    Medical: Not on file    Non-medical: Not on file  Tobacco Use  . Smoking status: Former Research scientist (life sciences)  .  Smokeless tobacco: Never Used  . Tobacco comment: smoked ages [age], up to 5 cigarettes/day  Substance and Sexual Activity  . Alcohol use: Yes    Alcohol/week: 4.0 standard drinks    Types: 4 Glasses of wine per week    Comment:  socially on weekends  . Drug use: No  . Sexual activity: Yes    Birth control/protection: Surgical  Lifestyle  . Physical activity    Days per week: Not on file    Minutes per session: Not on file  . Stress: Not on file  Relationships  . Social Herbalist on phone: Not on file    Gets together: Not on file    Attends religious service: Not on file    Active member of club or organization: Not on file    Attends meetings of clubs or organizations: Not on file    Relationship status: Not on file  Other Topics Concern  . Not on file  Social History Narrative   No regular exercise     Observations/Objective: Appears well in NAD Breathing normally Normal mood  Assessment and Plan:  See Problem List for Assessment and Plan of chronic medical problems.   Follow Up Instructions:    I discussed the assessment and treatment plan with the patient. The patient was provided an opportunity to ask questions and all were answered. The patient agreed  with the plan and demonstrated an understanding of the instructions.   The patient was advised to call back or seek an in-person evaluation if the symptoms worsen or if the condition fails to improve as anticipated.  Blood work ordered   Binnie Rail, MD

## 2019-02-05 NOTE — Progress Notes (Deleted)
Subjective:    Patient ID: Danielle Fischer, female    DOB: April 27, 1950, 68 y.o.   MRN: [id number]  HPI The patient is here for follow up.  She is exercising regularly.    Hypertension: She is taking her medication daily. She is compliant with a low sodium diet.  She denies chest pain, palpitations, edema, shortness of breath and regular headaches. She does not monitor her blood pressure at home.   Prediabetes:  She is compliant with a low sugar/carbohydrate diet.  She is exercising regularly.  CKD:    Vitamin D deficiency: She is taking calcium and vitamin D daily.  Medications and allergies reviewed with patient and updated if appropriate.  Patient Active Problem List   Diagnosis Date Noted  . Prediabetes 07/19/2017  . Chronic neck pain 05/15/2016  . Seasonal affective disorder (Benjamin Perez) 09/08/2012  . Hyperlipidemia 04/25/2011  . Uterine prolapse   . Cystocele   . Rectocele   . SUI (stress urinary incontinence, female)   . Osteopenia   . Essential hypertension 11/10/2007    Current Outpatient Medications on File Prior to Visit  Medication Sig Dispense Refill  . amitriptyline (ELAVIL) 25 MG tablet Take 1 tablet (25 mg total) by mouth at bedtime. 90 tablet 1  . amitriptyline (ELAVIL) 25 MG tablet Take 1 tablet (25 mg total) by mouth daily. 30 tablet 0  . Calcium Carbonate-Vitamin D (CALCIUM + D PO) Take by mouth 2 (two) times daily.      . metoprolol succinate (TOPROL-XL) 50 MG 24 hr tablet Take 1 tablet (50 mg total) by mouth daily. Take with or immediately following a meal. 30 tablet 0   No current facility-administered medications on file prior to visit.     Past Medical History:  Diagnosis Date  . Cystocele   . Hypertension   . Osteopenia   . Rectocele   . SUI (stress urinary incontinence, female)   . Uterine prolapse     Past Surgical History:  Procedure Laterality Date  . COLONOSCOPY  2009   negative; Dr Michaelyn Barter  . VAGINAL HYSTERECTOMY  1993   A/P Repair    Social History   Socioeconomic History  . Marital status: Married    Spouse name: Not on file  . Number of children: Not on file  . Years of education: Not on file  . Highest education level: Not on file  Occupational History  . Not on file  Social Needs  . Financial resource strain: Not on file  . Food insecurity    Worry: Not on file    Inability: Not on file  . Transportation needs    Medical: Not on file    Non-medical: Not on file  Tobacco Use  . Smoking status: Former Research scientist (life sciences)  . Smokeless tobacco: Never Used  . Tobacco comment: smoked ages [age], up to 5 cigarettes/day  Substance and Sexual Activity  . Alcohol use: Yes    Alcohol/week: 4.0 standard drinks    Types: 4 Glasses of wine per week    Comment:  socially on weekends  . Drug use: No  . Sexual activity: Yes    Birth control/protection: Surgical  Lifestyle  . Physical activity    Days per week: Not on file    Minutes per session: Not on file  . Stress: Not on file  Relationships  . Social Herbalist on phone: Not on file    Gets together: Not on file  Attends religious service: Not on file    Active member of club or organization: Not on file    Attends meetings of clubs or organizations: Not on file    Relationship status: Not on file  Other Topics Concern  . Not on file  Social History Narrative   No regular exercise    Family History  Problem Relation Age of Onset  . Hypertension Mother   . Osteoporosis Mother   . Heart disease Father        MI @ 34  . ALS Father   . Hypertension Brother   . Prostate cancer Brother   . Breast cancer Paternal Aunt   . Osteoporosis Maternal Grandmother   . Stroke Paternal Grandmother   . Diabetes Neg Hx     Review of Systems     Objective:  There were no vitals filed for this visit. BP Readings from Last 3 Encounters:  09/03/17 130/90  07/22/17 114/86  05/15/16 116/86   Wt Readings from Last 3 Encounters:  09/03/17 149  lb (67.6 kg)  07/22/17 156 lb (70.8 kg)  05/15/16 155 lb (70.3 kg)   There is no height or weight on file to calculate BMI.   Physical Exam    Constitutional: Appears well-developed and well-nourished. No distress.  HENT:  Head: Normocephalic and atraumatic.  Neck: Neck supple. No tracheal deviation present. No thyromegaly present.  No cervical lymphadenopathy Cardiovascular: Normal rate, regular rhythm and normal heart sounds.   No murmur heard. No carotid bruit .  No edema Pulmonary/Chest: Effort normal and breath sounds normal. No respiratory distress. No has no wheezes. No rales.  Skin: Skin is warm and dry. Not diaphoretic.  Psychiatric: Normal mood and affect. Behavior is normal.      Assessment & Plan:    See Problem List for Assessment and Plan of chronic medical problems.

## 2019-02-06 ENCOUNTER — Ambulatory Visit (INDEPENDENT_AMBULATORY_CARE_PROVIDER_SITE_OTHER): Payer: Medicare HMO | Admitting: Internal Medicine

## 2019-02-06 ENCOUNTER — Encounter: Payer: Self-pay | Admitting: Internal Medicine

## 2019-02-06 DIAGNOSIS — I10 Essential (primary) hypertension: Secondary | ICD-10-CM

## 2019-02-06 DIAGNOSIS — N1831 Chronic kidney disease, stage 3a: Secondary | ICD-10-CM

## 2019-02-06 DIAGNOSIS — E559 Vitamin D deficiency, unspecified: Secondary | ICD-10-CM | POA: Diagnosis not present

## 2019-02-06 DIAGNOSIS — R7303 Prediabetes: Secondary | ICD-10-CM

## 2019-02-06 MED ORDER — AMITRIPTYLINE HCL 25 MG PO TABS: 25.0000 mg | ORAL_TABLET | Freq: Every day | ORAL | 1 refills | Status: DC

## 2019-02-06 MED ORDER — METOPROLOL SUCCINATE ER 50 MG PO TB24: 50.0000 mg | ORAL_TABLET | Freq: Every day | ORAL | 1 refills | Status: DC

## 2019-02-06 NOTE — Assessment & Plan Note (Signed)
Advised her to check her blood pressure since we are not able to check it-make sure she checks it intermittently to make sure it is well controlled Continue metoprolol Blood work ordered and she will get that done when she is back in town-CBC, CMP, TSH

## 2019-02-06 NOTE — Assessment & Plan Note (Signed)
Taking calcium and vitamin D daily Will check vitamin D level

## 2019-02-06 NOTE — Assessment & Plan Note (Signed)
She drinks more water some days than others-stressed the importance of drinking a lot of water daily Rarely takes Advil Advised monitoring blood pressure Will check A1c, CMP, CBC

## 2019-02-06 NOTE — Assessment & Plan Note (Signed)
Check a1c Low sugar / carb diet Stressed regular exercise   

## 2019-02-13 ENCOUNTER — Other Ambulatory Visit (INDEPENDENT_AMBULATORY_CARE_PROVIDER_SITE_OTHER): Payer: Medicare HMO

## 2019-02-13 DIAGNOSIS — E559 Vitamin D deficiency, unspecified: Secondary | ICD-10-CM

## 2019-02-13 DIAGNOSIS — I10 Essential (primary) hypertension: Secondary | ICD-10-CM

## 2019-02-13 DIAGNOSIS — N1831 Chronic kidney disease, stage 3a: Secondary | ICD-10-CM

## 2019-02-13 DIAGNOSIS — R7303 Prediabetes: Secondary | ICD-10-CM | POA: Diagnosis not present

## 2019-02-13 LAB — COMPREHENSIVE METABOLIC PANEL
ALT: 10 U/L (ref 0–35)
AST: 15 U/L (ref 0–37)
Albumin: 4.2 g/dL (ref 3.5–5.2)
Alkaline Phosphatase: 65 U/L (ref 39–117)
BUN: 22 mg/dL (ref 6–23)
CO2: 28 mEq/L (ref 19–32)
Calcium: 9.4 mg/dL (ref 8.4–10.5)
Chloride: 104 mEq/L (ref 96–112)
Creatinine, Ser: 1.3 mg/dL — ABNORMAL HIGH (ref 0.40–1.20)
GFR: 40.74 mL/min — ABNORMAL LOW (ref >60.00)
Glucose, Bld: 107 mg/dL — ABNORMAL HIGH (ref 70–99)
Potassium: 4.4 mEq/L (ref 3.5–5.1)
Sodium: 139 mEq/L (ref 135–145)
Total Bilirubin: 0.7 mg/dL (ref 0.2–1.2)
Total Protein: 6.9 g/dL (ref 6.0–8.3)

## 2019-02-13 LAB — CBC WITH DIFFERENTIAL/PLATELET
Basophils Absolute: 0 10*3/uL (ref 0.0–0.1)
Basophils Relative: 0.7 % (ref 0.0–3.0)
Eosinophils Absolute: 0.2 10*3/uL (ref 0.0–0.7)
Eosinophils Relative: 3.5 % (ref 0.0–5.0)
HCT: 38.5 % (ref 36.0–46.0)
Hemoglobin: 12.8 g/dL (ref 12.0–15.0)
Lymphocytes Relative: 42.4 % (ref 12.0–46.0)
Lymphs Abs: 2.8 10*3/uL (ref 0.7–4.0)
MCHC: 33.2 g/dL (ref 30.0–36.0)
MCV: 96.3 fl (ref 78.0–100.0)
Monocytes Absolute: 0.5 10*3/uL (ref 0.1–1.0)
Monocytes Relative: 7.1 % (ref 3.0–12.0)
Neutro Abs: 3 10*3/uL (ref 1.4–7.7)
Neutrophils Relative %: 46.3 % (ref 43.0–77.0)
Platelets: 245 10*3/uL (ref 150.0–400.0)
RBC: 4 Mil/uL (ref 3.87–5.11)
RDW: 12.7 % (ref 11.5–15.5)
WBC: 6.6 10*3/uL (ref 4.0–10.5)

## 2019-02-13 LAB — VITAMIN D 25 HYDROXY (VIT D DEFICIENCY, FRACTURES): VITD: 26.8 ng/mL — ABNORMAL LOW (ref 30.00–100.00)

## 2019-02-13 LAB — HEMOGLOBIN A1C: Hgb A1c MFr Bld: 5.5 % (ref 4.6–6.5)

## 2019-02-13 LAB — TSH: TSH: 2.33 u[IU]/mL (ref 0.35–4.50)

## 2019-03-05 ENCOUNTER — Other Ambulatory Visit: Payer: Self-pay | Admitting: Internal Medicine

## 2019-05-29 ENCOUNTER — Other Ambulatory Visit: Payer: BC Managed Care – PPO

## 2019-09-03 ENCOUNTER — Other Ambulatory Visit: Payer: Self-pay

## 2019-09-03 MED ORDER — METOPROLOL SUCCINATE ER 50 MG PO TB24: ORAL_TABLET | ORAL | 1 refills | Status: DC

## 2019-09-03 MED ORDER — AMITRIPTYLINE HCL 25 MG PO TABS: 25.0000 mg | ORAL_TABLET | Freq: Every day | ORAL | 0 refills | Status: DC

## 2019-11-29 ENCOUNTER — Other Ambulatory Visit: Payer: Self-pay | Admitting: Internal Medicine

## 2020-01-28 ENCOUNTER — Other Ambulatory Visit: Payer: Self-pay | Admitting: Internal Medicine

## 2020-01-28 ENCOUNTER — Other Ambulatory Visit: Payer: Self-pay | Admitting: Family

## 2020-02-03 ENCOUNTER — Telehealth: Payer: Self-pay | Admitting: Internal Medicine

## 2020-02-03 DIAGNOSIS — N1831 Chronic kidney disease, stage 3a: Secondary | ICD-10-CM

## 2020-02-03 DIAGNOSIS — R7303 Prediabetes: Secondary | ICD-10-CM

## 2020-02-03 DIAGNOSIS — E559 Vitamin D deficiency, unspecified: Secondary | ICD-10-CM

## 2020-02-03 DIAGNOSIS — I10 Essential (primary) hypertension: Secondary | ICD-10-CM

## 2020-02-03 DIAGNOSIS — E782 Mixed hyperlipidemia: Secondary | ICD-10-CM

## 2020-02-03 NOTE — Telephone Encounter (Signed)
    Patient requesting order for annual labs to be done at [facility] location

## 2020-02-04 NOTE — Telephone Encounter (Signed)
ordered

## 2020-02-04 NOTE — Telephone Encounter (Signed)
Message left for patient that orders have been placed at [facility].

## 2020-02-15 ENCOUNTER — Other Ambulatory Visit (INDEPENDENT_AMBULATORY_CARE_PROVIDER_SITE_OTHER): Payer: Medicare HMO

## 2020-02-15 DIAGNOSIS — I10 Essential (primary) hypertension: Secondary | ICD-10-CM | POA: Diagnosis not present

## 2020-02-15 DIAGNOSIS — N1831 Chronic kidney disease, stage 3a: Secondary | ICD-10-CM | POA: Diagnosis not present

## 2020-02-15 DIAGNOSIS — E782 Mixed hyperlipidemia: Secondary | ICD-10-CM | POA: Diagnosis not present

## 2020-02-15 DIAGNOSIS — R7303 Prediabetes: Secondary | ICD-10-CM | POA: Diagnosis not present

## 2020-02-15 DIAGNOSIS — E559 Vitamin D deficiency, unspecified: Secondary | ICD-10-CM

## 2020-02-15 LAB — COMPREHENSIVE METABOLIC PANEL
ALT: 10 U/L (ref 0–35)
AST: 16 U/L (ref 0–37)
Albumin: 4 g/dL (ref 3.5–5.2)
Alkaline Phosphatase: 73 U/L (ref 39–117)
BUN: 19 mg/dL (ref 6–23)
CO2: 26 mEq/L (ref 19–32)
Calcium: 9.2 mg/dL (ref 8.4–10.5)
Chloride: 105 mEq/L (ref 96–112)
Creatinine, Ser: 1.38 mg/dL — ABNORMAL HIGH (ref 0.40–1.20)
GFR: 39.21 mL/min — ABNORMAL LOW (ref >60.00)
Glucose, Bld: 96 mg/dL (ref 70–99)
Potassium: 4.4 mEq/L (ref 3.5–5.1)
Sodium: 138 mEq/L (ref 135–145)
Total Bilirubin: 0.6 mg/dL (ref 0.2–1.2)
Total Protein: 6.5 g/dL (ref 6.0–8.3)

## 2020-02-15 LAB — CBC WITH DIFFERENTIAL/PLATELET
Basophils Absolute: 0 10*3/uL (ref 0.0–0.1)
Basophils Relative: 0.5 % (ref 0.0–3.0)
Eosinophils Absolute: 0.2 10*3/uL (ref 0.0–0.7)
Eosinophils Relative: 3.2 % (ref 0.0–5.0)
HCT: 37.9 % (ref 36.0–46.0)
Hemoglobin: 12.8 g/dL (ref 12.0–15.0)
Lymphocytes Relative: 44.1 % (ref 12.0–46.0)
Lymphs Abs: 2.2 10*3/uL (ref 0.7–4.0)
MCHC: 33.8 g/dL (ref 30.0–36.0)
MCV: 94.3 fl (ref 78.0–100.0)
Monocytes Absolute: 0.4 10*3/uL (ref 0.1–1.0)
Monocytes Relative: 7.9 % (ref 3.0–12.0)
Neutro Abs: 2.2 10*3/uL (ref 1.4–7.7)
Neutrophils Relative %: 44.3 % (ref 43.0–77.0)
Platelets: 235 10*3/uL (ref 150.0–400.0)
RBC: 4.02 Mil/uL (ref 3.87–5.11)
RDW: 12.8 % (ref 11.5–15.5)
WBC: 5.1 10*3/uL (ref 4.0–10.5)

## 2020-02-15 LAB — LIPID PANEL
Cholesterol: 210 mg/dL — ABNORMAL HIGH (ref 0–200)
HDL: 70.7 mg/dL (ref >39.00)
LDL Cholesterol: 116 mg/dL — ABNORMAL HIGH (ref 0–99)
NonHDL: 139.43
Total CHOL/HDL Ratio: 3
Triglycerides: 119 mg/dL (ref 0.0–149.0)
VLDL: 23.8 mg/dL (ref 0.0–40.0)

## 2020-02-15 LAB — HEMOGLOBIN A1C: Hgb A1c MFr Bld: 5.9 % (ref 4.6–6.5)

## 2020-02-15 LAB — TSH: TSH: 1.6 u[IU]/mL (ref 0.35–4.50)

## 2020-02-15 LAB — VITAMIN D 25 HYDROXY (VIT D DEFICIENCY, FRACTURES): VITD: 25.54 ng/mL — ABNORMAL LOW (ref 30.00–100.00)

## 2020-02-23 NOTE — Patient Instructions (Addendum)
Consider the shingles vaccine and tetanus vaccine at your pharmacy.   All other Health Maintenance issues reviewed.   All recommended immunizations and age-appropriate screenings are up-to-date or discussed.  Flu immunization administered today.   Medications reviewed and updated.  Changes include :   none  Your prescription(s) have been submitted to your pharmacy. Please take as directed and contact our office if you believe you are having problem(s) with the medication(s).  A referral was ordered for the kidney doctor.    Someone will call you to schedule an appointment.   A referral was ordered for the GI for your colonoscopy.  Someone will call you to schedule an appointment.   An ultrasound of your kidneys was ordered.  Someone will call you to schedule an appointment.   A bone density was ordered for [facility] - you can schedule this with your next mammogram.    Please followup in 1 year   Health Maintenance, Female Adopting a healthy lifestyle and getting preventive care are important in promoting health and wellness. Ask your health care provider about:  The right schedule for you to have regular tests and exams.  Things you can do on your own to prevent diseases and keep yourself healthy. What should I know about diet, weight, and exercise? Eat a healthy diet   Eat a diet that includes plenty of vegetables, fruits, low-fat dairy products, and lean protein.  Do not eat a lot of foods that are high in solid fats, added sugars, or sodium. Maintain a healthy weight Body mass index (BMI) is used to identify weight problems. It estimates body fat based on height and weight. Your health care provider can help determine your BMI and help you achieve or maintain a healthy weight. Get regular exercise Get regular exercise. This is one of the most important things you can do for your health. Most adults should:  Exercise for at least 150 minutes each week. The exercise should  increase your heart rate and make you sweat (moderate-intensity exercise).  Do strengthening exercises at least twice a week. This is in addition to the moderate-intensity exercise.  Spend less time sitting. Even light physical activity can be beneficial. Watch cholesterol and blood lipids Have your blood tested for lipids and cholesterol at 69 years of age, then have this test every 5 years. Have your cholesterol levels checked more often if:  Your lipid or cholesterol levels are high.  You are older than 69 years of age.  You are at high risk for heart disease. What should I know about cancer screening? Depending on your health history and family history, you may need to have cancer screening at various ages. This may include screening for:  Breast cancer.  Cervical cancer.  Colorectal cancer.  Skin cancer.  Lung cancer. What should I know about heart disease, diabetes, and high blood pressure? Blood pressure and heart disease  High blood pressure causes heart disease and increases the risk of stroke. This is more likely to develop in people who have high blood pressure readings, are of African descent, or are overweight.  Have your blood pressure checked: ? Every 3-5 years if you are 52-68 years of age. ? Every year if you are 81 years old or older. Diabetes Have regular diabetes screenings. This checks your fasting blood sugar level. Have the screening done:  Once every three years after age 60 if you are at a normal weight and have a low risk for diabetes.  More  often and at a younger age if you are overweight or have a high risk for diabetes. What should I know about preventing infection? Hepatitis B If you have a higher risk for hepatitis B, you should be screened for this virus. Talk with your health care provider to find out if you are at risk for hepatitis B infection. Hepatitis C Testing is recommended for:  Everyone born from [birth year] through 1965.  Anyone with  known risk factors for hepatitis C. Sexually transmitted infections (STIs)  Get screened for STIs, including gonorrhea and chlamydia, if: ? You are sexually active and are younger than 69 years of age. ? You are older than 69 years of age and your health care provider tells you that you are at risk for this type of infection. ? Your sexual activity has changed since you were last screened, and you are at increased risk for chlamydia or gonorrhea. Ask your health care provider if you are at risk.  Ask your health care provider about whether you are at high risk for HIV. Your health care provider may recommend a prescription medicine to help prevent HIV infection. If you choose to take medicine to prevent HIV, you should first get tested for HIV. You should then be tested every 3 months for as long as you are taking the medicine. Pregnancy  If you are about to stop having your period (premenopausal) and you may become pregnant, seek counseling before you get pregnant.  Take 400 to 800 micrograms (mcg) of folic acid every day if you become pregnant.  Ask for birth control (contraception) if you want to prevent pregnancy. Osteoporosis and menopause Osteoporosis is a disease in which the bones lose minerals and strength with aging. This can result in bone fractures. If you are 72 years old or older, or if you are at risk for osteoporosis and fractures, ask your health care provider if you should:  Be screened for bone loss.  Take a calcium or vitamin D supplement to lower your risk of fractures.  Be given hormone replacement therapy (HRT) to treat symptoms of menopause. Follow these instructions at home: Lifestyle  Do not use any products that contain nicotine or tobacco, such as cigarettes, e-cigarettes, and chewing tobacco. If you need help quitting, ask your health care provider.  Do not use street drugs.  Do not share needles.  Ask your health care provider for help if you need  support or information about quitting drugs. Alcohol use  Do not drink alcohol if: ? Your health care provider tells you not to drink. ? You are pregnant, may be pregnant, or are planning to become pregnant.  If you drink alcohol: ? Limit how much you use to 0-1 drink a day. ? Limit intake if you are breastfeeding.  Be aware of how much alcohol is in your drink. In the [place] one drink equals one 12 oz bottle of beer (355 mL), one 5 oz glass of wine (148 mL), or one 1 oz glass of hard liquor (44 mL). General instructions  Schedule regular health, dental, and eye exams.  Stay current with your vaccines.  Tell your health care provider if: ? You often feel depressed. ? You have ever been abused or do not feel safe at home. Summary  Adopting a healthy lifestyle and getting preventive care are important in promoting health and wellness.  Follow your health care provider's instructions about healthy diet, exercising, and getting tested or screened for diseases.  Follow your  health care provider's instructions on monitoring your cholesterol and blood pressure. This information is not intended to replace advice given to you by your health care provider. Make sure you discuss any questions you have with your health care provider. Document Revised: 04/02/2018 Document Reviewed: 04/02/2018 Elsevier Patient Education  2020 [employer].

## 2020-02-23 NOTE — Progress Notes (Signed)
Subjective:    Patient ID: Danielle Fischer, female    DOB: [birth year], 69 y.o.   MRN: [id number]   This visit occurred during the SARS-CoV-2 public health emergency.  Safety protocols were in place, including screening questions prior to the visit, additional usage of staff PPE, and extensive cleaning of exam room while observing appropriate contact time as indicated for disinfecting solutions.    HPI She is here for a physical exam.   Reviewed blood work she had done.    She takes advil at most a couple of times a month - only takes as needed for headaches.     Medications and allergies reviewed with patient and updated if appropriate.  Patient Active Problem List   Diagnosis Date Noted  . Herpes simplex 02/24/2020  . Vitamin D deficiency 02/05/2019  . CKD (chronic kidney disease) stage 3, GFR 30-59 ml/min ([name]) 02/05/2019  . Prediabetes 07/19/2017  . Chronic neck pain 05/15/2016  . Seasonal affective disorder ([employer]) 09/08/2012  . Hyperlipidemia 04/25/2011  . Uterine prolapse   . Cystocele   . Rectocele   . SUI (stress urinary incontinence, female)   . Osteopenia   . Essential hypertension 11/10/2007    Current Outpatient Medications on File Prior to Visit  Medication Sig Dispense Refill  . amitriptyline (ELAVIL) 25 MG tablet Take 1 tablet (25 mg total) by mouth at bedtime. Follow up needed for additional refills 30 tablet 0  . Calcium Carbonate-Vitamin D (CALCIUM + D PO) Take by mouth 2 (two) times daily.      . metoprolol succinate (TOPROL-XL) 50 MG 24 hr tablet TAKE 1 TABLET BY MOUTH ONCE DAILY. TAKE WITH FOOD OR IMMEDIATELY FOLLOWING A MEAL 90 tablet 1   No current facility-administered medications on file prior to visit.    Past Medical History:  Diagnosis Date  . Cystocele   . Hypertension   . Osteopenia   . Rectocele   . SUI (stress urinary incontinence, female)   . Uterine prolapse     Past Surgical History:  Procedure Laterality Date  . COLONOSCOPY   2009   negative; Dr Michaelyn Barter  . VAGINAL HYSTERECTOMY  1993   A/P Repair    Social History   Socioeconomic History  . Marital status: Married    Spouse name: Not on file  . Number of children: Not on file  . Years of education: Not on file  . Highest education level: Not on file  Occupational History  . Not on file  Tobacco Use  . Smoking status: Former Research scientist (life sciences)  . Smokeless tobacco: Never Used  . Tobacco comment: smoked ages [age], up to 5 cigarettes/day  Substance and Sexual Activity  . Alcohol use: Yes    Alcohol/week: 4.0 standard drinks    Types: 4 Glasses of wine per week    Comment:  socially on weekends  . Drug use: No  . Sexual activity: Yes    Birth control/protection: Surgical  Other Topics Concern  . Not on file  Social History Narrative   No regular exercise   Social Determinants of Health   Financial Resource Strain:   . Difficulty of Paying Living Expenses: Not on file  Food Insecurity:   . Worried About Charity fundraiser in the Last Year: Not on file  . Ran Out of Food in the Last Year: Not on file  Transportation Needs:   . Lack of Transportation (Medical): Not on file  . Lack of Transportation (Non-Medical):  Not on file  Physical Activity:   . Days of Exercise per Week: Not on file  . Minutes of Exercise per Session: Not on file  Stress:   . Feeling of Stress : Not on file  Social Connections:   . Frequency of Communication with Friends and Family: Not on file  . Frequency of Social Gatherings with Friends and Family: Not on file  . Attends Religious Services: Not on file  . Active Member of Clubs or Organizations: Not on file  . Attends Archivist Meetings: Not on file  . Marital Status: Not on file    Family History  Problem Relation Age of Onset  . Hypertension Mother   . Osteoporosis Mother   . Heart disease Father        MI @ 26  . ALS Father   . Hypertension Brother   . Prostate cancer Brother   . Breast  cancer Paternal Aunt   . Osteoporosis Maternal Grandmother   . Stroke Paternal Grandmother   . Diabetes Neg Hx     Review of Systems  Constitutional: Negative for fever.  Eyes: Negative for visual disturbance.  Respiratory: Negative for cough, shortness of breath and wheezing.   Cardiovascular: Negative for chest pain, palpitations and leg swelling.  Gastrointestinal: Negative for abdominal pain, blood in stool, constipation, diarrhea and nausea.       No gerd  Genitourinary: Negative for dysuria and hematuria.  Musculoskeletal: Positive for arthralgias (mild). Negative for back pain.  Skin: Negative for color change and rash.  Neurological: Positive for headaches (occ, mild). Negative for dizziness and light-headedness.  Psychiatric/Behavioral: Negative for dysphoric mood. The patient is not nervous/anxious.        Objective:   Vitals:   02/24/20 1310  BP: 126/80  Pulse: 75  Temp: 98.1 F (36.7 C)  SpO2: 97%   Filed Weights   02/24/20 1310  Weight: 142 lb 9.6 oz (64.7 kg)   Body mass index is 24.48 kg/m.  BP Readings from Last 3 Encounters:  02/24/20 126/80  09/03/17 130/90  07/22/17 114/86    Wt Readings from Last 3 Encounters:  02/24/20 142 lb 9.6 oz (64.7 kg)  09/03/17 149 lb (67.6 kg)  07/22/17 156 lb (70.8 kg)     Physical Exam Constitutional: She appears well-developed and well-nourished. No distress.  HENT:  Head: Normocephalic and atraumatic.  Right Ear: External ear normal. Normal ear canal and TM Left Ear: External ear normal.  Normal ear canal and TM Mouth/Throat: Oropharynx is clear and moist.  Eyes: Conjunctivae and EOM are normal.  Neck: Neck supple. No tracheal deviation present. No thyromegaly present.  No carotid bruit  Cardiovascular: Normal rate, regular rhythm and normal heart sounds.   No murmur heard.  No edema. Pulmonary/Chest: Effort normal and breath sounds normal. No respiratory distress. She has no wheezes. She has no rales.    Breast: deferred   Abdominal: Soft. She exhibits no distension. There is no tenderness.  Lymphadenopathy: She has no cervical adenopathy.  Skin: Skin is warm and dry. She is not diaphoretic.  Psychiatric: She has a normal mood and affect. Her behavior is normal.        Assessment & Plan:   Physical exam: Screening blood work    ordered Immunizations  had Had covid, flu vac today, tdap and shingrix advised Colonoscopy  Due  - will schedule - referral ordered Mammogram  Up to date  Gyn  n/a Dexa  Due - order  for [facility] exams  Up to date  Exercise  irregular Weight  Good for age Substance abuse   none  Advised taking calcium and vitamin d daily.  Encouraged regular exercise.     See Problem List for Assessment and Plan of chronic medical problems.

## 2020-02-24 ENCOUNTER — Encounter: Payer: Self-pay | Admitting: Internal Medicine

## 2020-02-24 ENCOUNTER — Other Ambulatory Visit: Payer: Self-pay

## 2020-02-24 ENCOUNTER — Ambulatory Visit (INDEPENDENT_AMBULATORY_CARE_PROVIDER_SITE_OTHER): Payer: Medicare HMO | Admitting: Internal Medicine

## 2020-02-24 VITALS — BP 126/80 | HR 75 | Temp 98.1°F | Ht 64.0 in | Wt 142.6 lb

## 2020-02-24 DIAGNOSIS — Z1211 Encounter for screening for malignant neoplasm of colon: Secondary | ICD-10-CM

## 2020-02-24 DIAGNOSIS — R7303 Prediabetes: Secondary | ICD-10-CM | POA: Diagnosis not present

## 2020-02-24 DIAGNOSIS — I10 Essential (primary) hypertension: Secondary | ICD-10-CM | POA: Diagnosis not present

## 2020-02-24 DIAGNOSIS — Z23 Encounter for immunization: Secondary | ICD-10-CM

## 2020-02-24 DIAGNOSIS — E559 Vitamin D deficiency, unspecified: Secondary | ICD-10-CM

## 2020-02-24 DIAGNOSIS — M8589 Other specified disorders of bone density and structure, multiple sites: Secondary | ICD-10-CM

## 2020-02-24 DIAGNOSIS — Z13820 Encounter for screening for osteoporosis: Secondary | ICD-10-CM

## 2020-02-24 DIAGNOSIS — B009 Herpesviral infection, unspecified: Secondary | ICD-10-CM

## 2020-02-24 DIAGNOSIS — Z0000 Encounter for general adult medical examination without abnormal findings: Secondary | ICD-10-CM

## 2020-02-24 DIAGNOSIS — G8929 Other chronic pain: Secondary | ICD-10-CM

## 2020-02-24 DIAGNOSIS — M542 Cervicalgia: Secondary | ICD-10-CM

## 2020-02-24 DIAGNOSIS — E782 Mixed hyperlipidemia: Secondary | ICD-10-CM | POA: Diagnosis not present

## 2020-02-24 DIAGNOSIS — N1832 Chronic kidney disease, stage 3b: Secondary | ICD-10-CM

## 2020-02-24 MED ORDER — VALACYCLOVIR HCL 1 G PO TABS: 2000.0000 mg | ORAL_TABLET | Freq: Two times a day (BID) | ORAL | 5 refills | Status: AC | PRN

## 2020-02-24 MED ORDER — METOPROLOL SUCCINATE ER 50 MG PO TB24
ORAL_TABLET | ORAL | 3 refills | Status: DC
Start: 2020-02-24 — End: 2020-12-13

## 2020-02-24 MED ORDER — AMITRIPTYLINE HCL 25 MG PO TABS
25.0000 mg | ORAL_TABLET | Freq: Every day | ORAL | 3 refills | Status: DC
Start: 2020-02-24 — End: 2021-03-13

## 2020-02-24 NOTE — Assessment & Plan Note (Signed)
Chronic Controlled Continue elavil 25 mg bedtime

## 2020-02-24 NOTE — Assessment & Plan Note (Signed)
Chronic Intermittent Valtrex prn - 2000mg  Q12 hr x 1 day prn

## 2020-02-24 NOTE — Assessment & Plan Note (Signed)
Chronic Vitamin d level slightly low Advised taking vitamin d daily

## 2020-02-24 NOTE — Assessment & Plan Note (Signed)
Chronic dexa due - ordered Advised taking cal/vit d daily Stressed regular exercise

## 2020-02-24 NOTE — Assessment & Plan Note (Signed)
Chronic Progressive w/o obvious cause Renal US Refer to [facility]

## 2020-02-24 NOTE — Assessment & Plan Note (Signed)
Chronic BP well controlled Continue metoprolol XL 50 mg bedtime cmp reviewed

## 2020-02-24 NOTE — Assessment & Plan Note (Addendum)
Chronic Lipids controlled Diet controlled Regular exercise and healthy diet encouraged

## 2020-02-24 NOTE — Assessment & Plan Note (Signed)
Chronic Lab Results  Component Value Date   HGBA1C 5.9 02/15/2020    Low sugar / carb diet Stressed regular exercise

## 2020-02-25 ENCOUNTER — Ambulatory Visit: Payer: Medicare HMO

## 2020-02-25 NOTE — Addendum Note (Signed)
Addended by: Marcina Millard on: 02/25/2020 04:52 PM   Modules accepted: Orders

## 2020-03-03 ENCOUNTER — Ambulatory Visit
Admission: RE | Admit: 2020-03-03 | Discharge: 2020-03-03 | Disposition: A | Discharge status: Home / Self Care | Payer: Medicare HMO | Source: Ambulatory Visit | Attending: Internal Medicine | Admitting: Internal Medicine

## 2020-03-03 DIAGNOSIS — N1832 Chronic kidney disease, stage 3b: Secondary | ICD-10-CM

## 2020-08-09 DIAGNOSIS — N1832 Chronic kidney disease, stage 3b: Secondary | ICD-10-CM | POA: Diagnosis not present

## 2020-08-16 DIAGNOSIS — Z1231 Encounter for screening mammogram for malignant neoplasm of breast: Secondary | ICD-10-CM | POA: Diagnosis not present

## 2020-08-16 DIAGNOSIS — E559 Vitamin D deficiency, unspecified: Secondary | ICD-10-CM | POA: Diagnosis not present

## 2020-08-16 DIAGNOSIS — N1832 Chronic kidney disease, stage 3b: Secondary | ICD-10-CM | POA: Diagnosis not present

## 2020-08-16 DIAGNOSIS — R7303 Prediabetes: Secondary | ICD-10-CM | POA: Diagnosis not present

## 2020-08-16 DIAGNOSIS — I129 Hypertensive chronic kidney disease with stage 1 through stage 4 chronic kidney disease, or unspecified chronic kidney disease: Secondary | ICD-10-CM | POA: Diagnosis not present

## 2020-12-09 IMAGING — US US RENAL
1 series · 14 of 25 positions shown · non-contrast
Comparison: None.

CLINICAL DATA: Stage III B chronic renal insufficiency,
hypertension

EXAM:
RENAL / URINARY TRACT ULTRASOUND COMPLETE

[Series 1: us renal · 0.22mm/px · 14 of 45 slices shown]
[im 1/45]
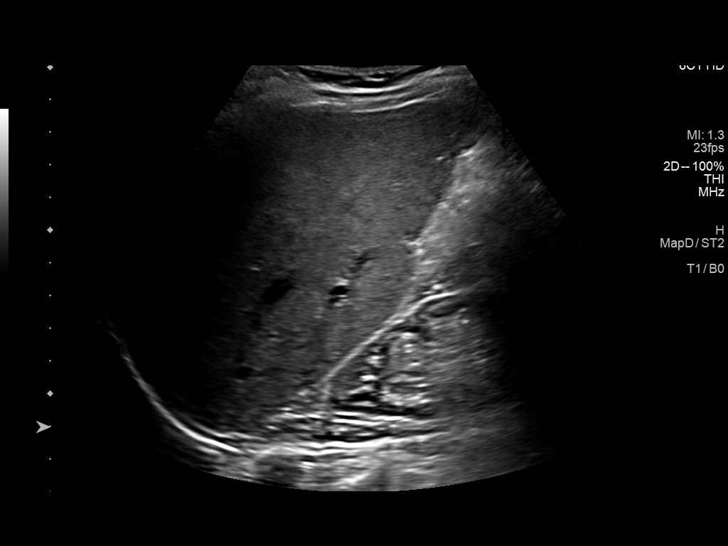
[im 4/45]
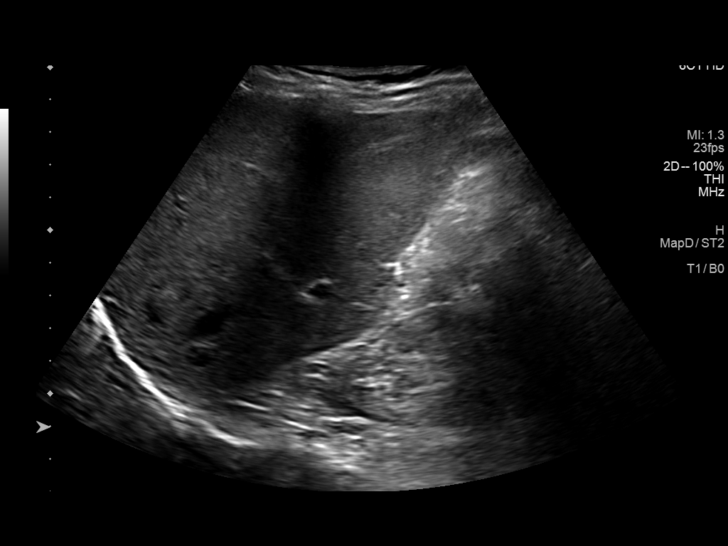
[im 8/45]
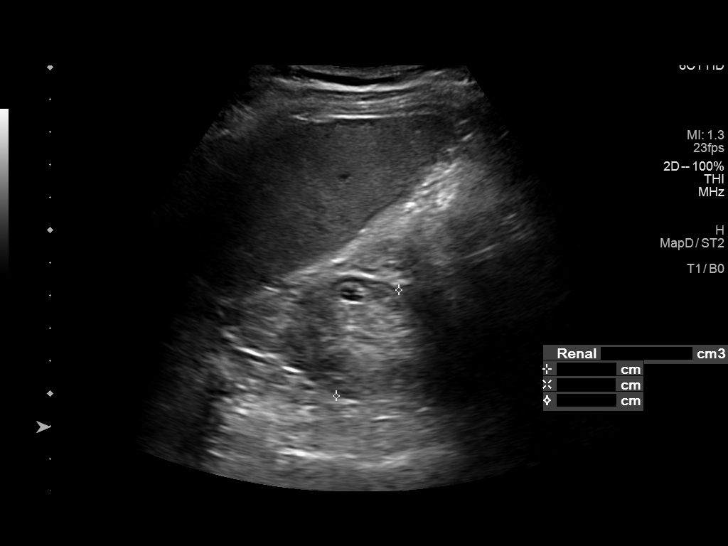
[im 12/45]
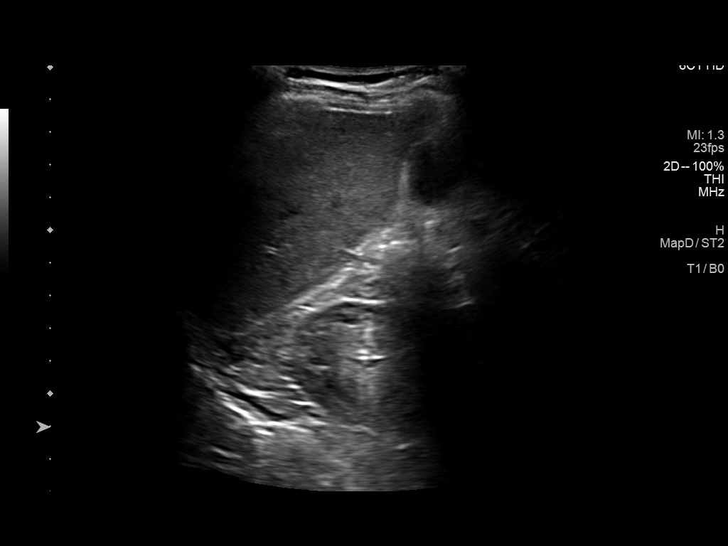
[im 15/45]
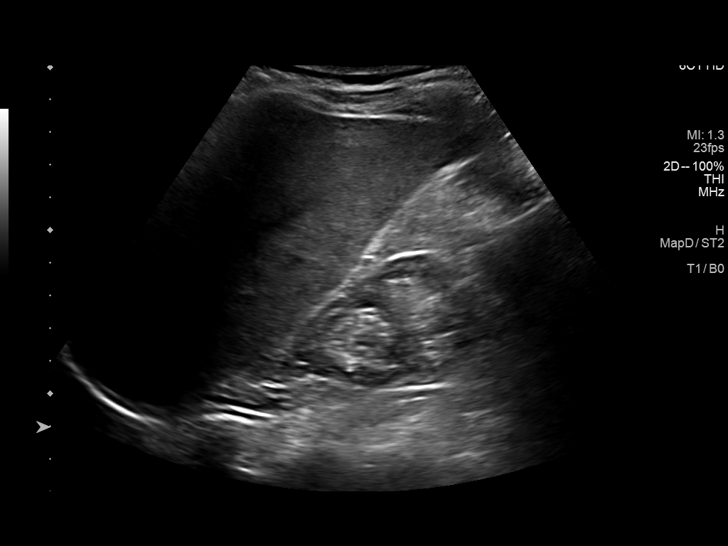
[im 17/45]
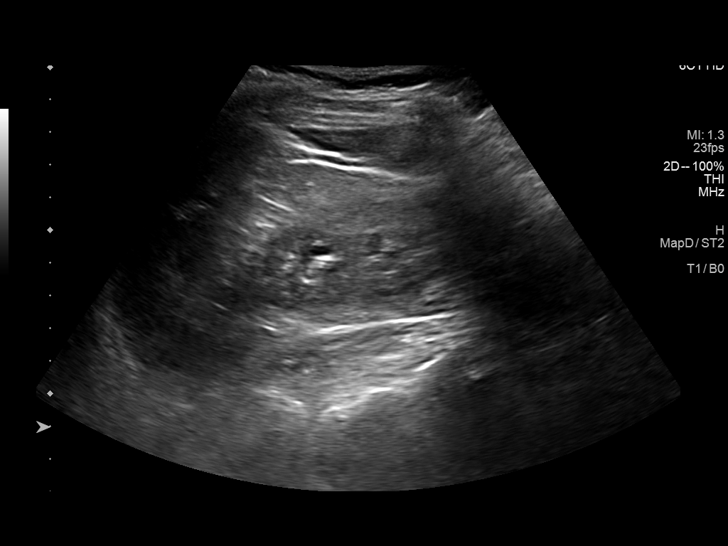
[im 21/45]
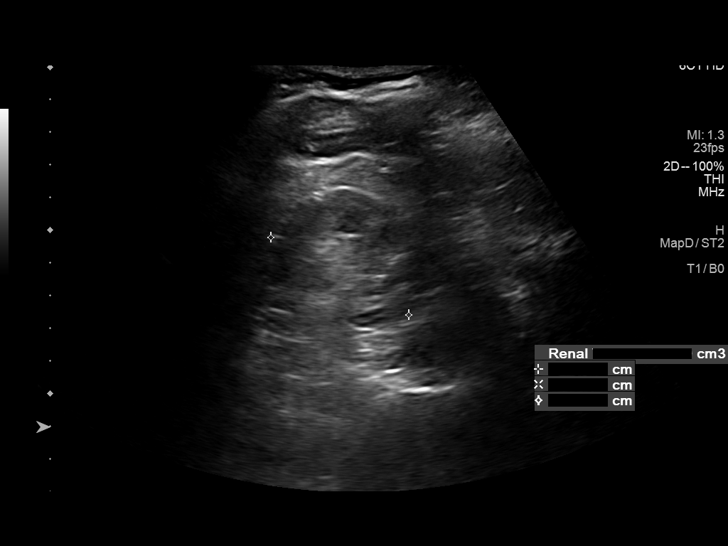
[im 24/45]
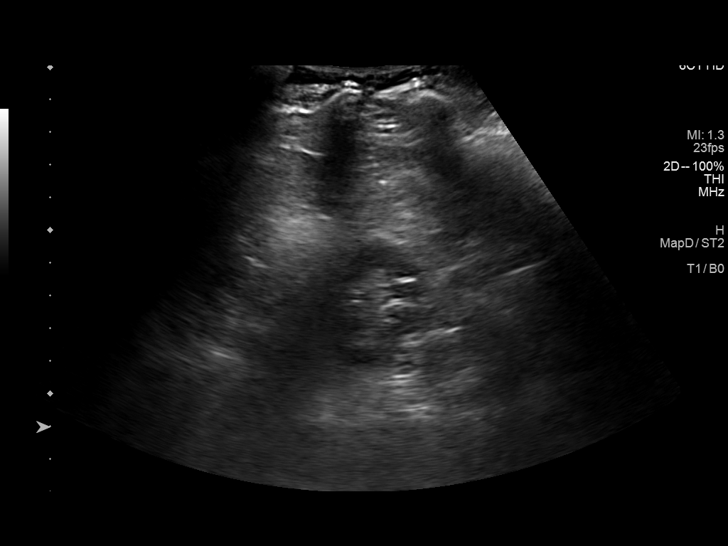
[im 28/45]
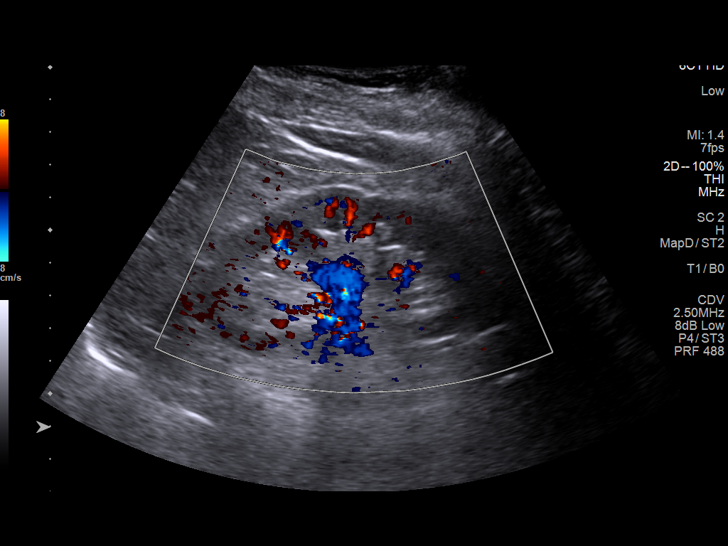
[im 30/45]
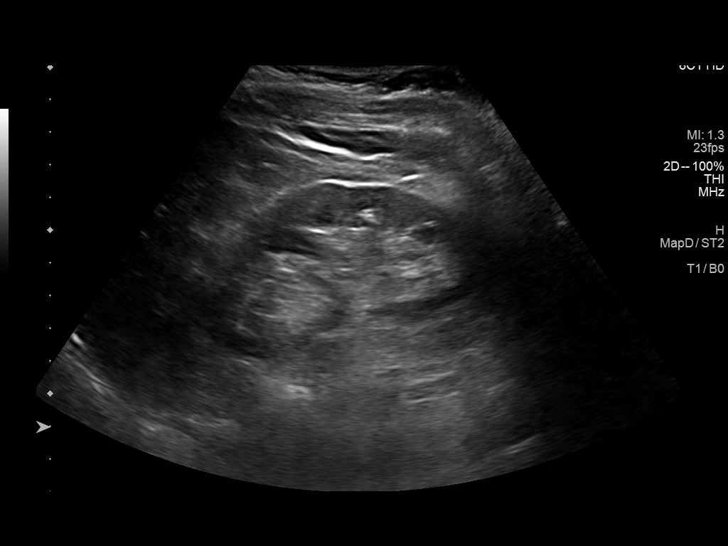
[im 34/45]
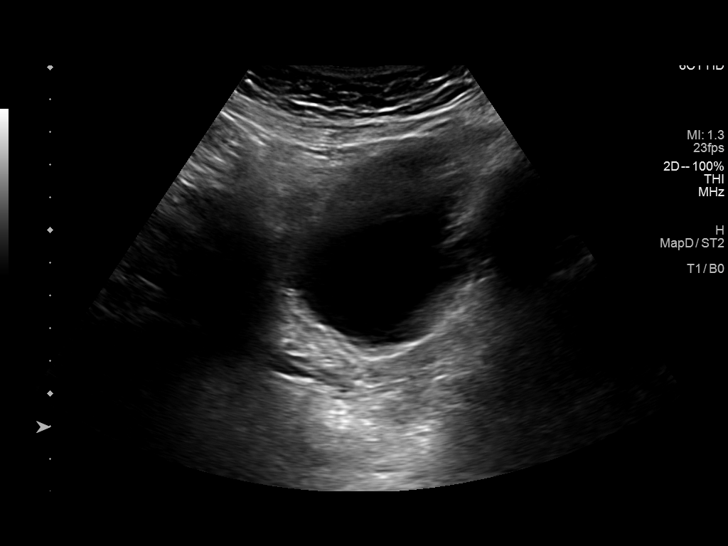
[im 37/45]
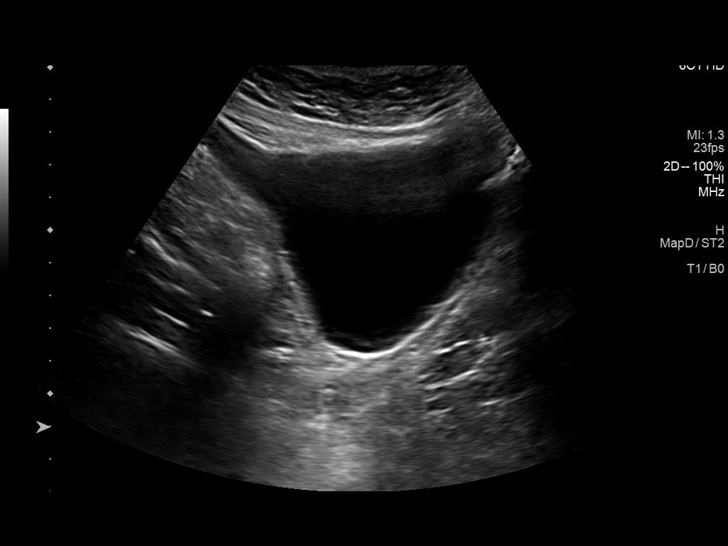
[im 41/45]
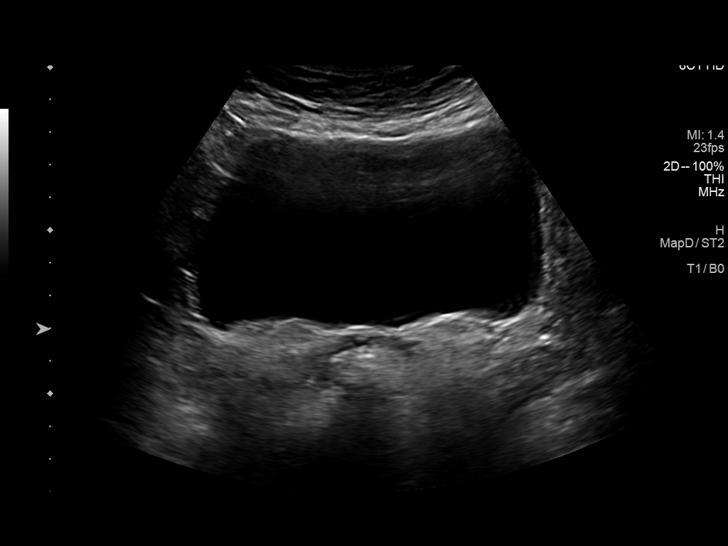
[im 45/45]
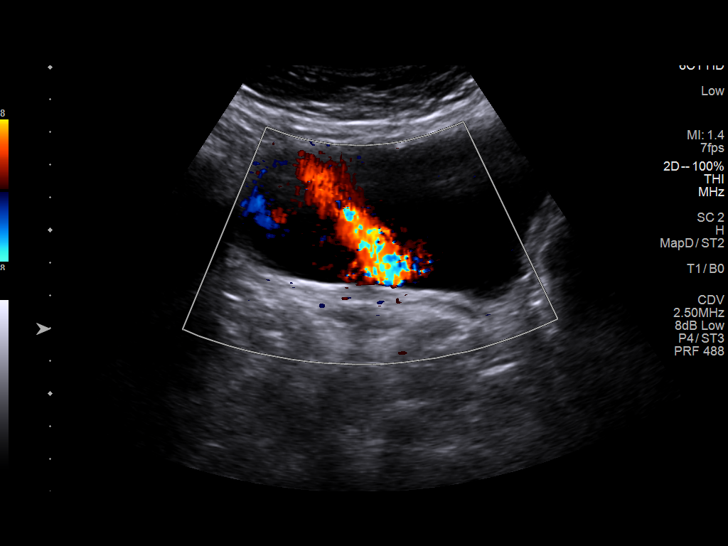

[14 of 25 positions shown; findings below may reference images not displayed]

FINDINGS: Right Kidney:

Renal measurements: 8.5 x 3.7 x 3.8 cm = volume: 61.9 mL. There is
increased renal cortical echotexture with cortical atrophy. No
hydronephrosis or nephrolithiasis.

Left Kidney:

Renal measurements: 9.4 x 4.5 x 4.8 cm = volume: 107.0 mL. Increased
renal cortical echotexture. No hydronephrosis or nephrolithiasis.

Bladder:

Appears normal for degree of bladder distention.

Other:

None.
IMPRESSION: 1. Increased renal cortical echotexture consistent with medical
renal disease.
2. Mild right renal cortical atrophy.

## 2020-12-11 ENCOUNTER — Other Ambulatory Visit: Payer: Self-pay | Admitting: Internal Medicine

## 2021-01-06 DIAGNOSIS — H2513 Age-related nuclear cataract, bilateral: Secondary | ICD-10-CM | POA: Diagnosis not present

## 2021-01-06 DIAGNOSIS — H16223 Keratoconjunctivitis sicca, not specified as Sjogren's, bilateral: Secondary | ICD-10-CM | POA: Diagnosis not present

## 2021-01-06 DIAGNOSIS — H43812 Vitreous degeneration, left eye: Secondary | ICD-10-CM | POA: Diagnosis not present

## 2021-01-10 ENCOUNTER — Ambulatory Visit: Payer: Medicare HMO

## 2021-01-19 ENCOUNTER — Ambulatory Visit: Payer: Medicare HMO

## 2021-02-24 ENCOUNTER — Telehealth: Payer: Self-pay | Admitting: Internal Medicine

## 2021-02-24 DIAGNOSIS — R7303 Prediabetes: Secondary | ICD-10-CM

## 2021-02-24 DIAGNOSIS — Z0000 Encounter for general adult medical examination without abnormal findings: Secondary | ICD-10-CM

## 2021-02-24 DIAGNOSIS — E559 Vitamin D deficiency, unspecified: Secondary | ICD-10-CM

## 2021-02-24 DIAGNOSIS — I10 Essential (primary) hypertension: Secondary | ICD-10-CM

## 2021-02-24 DIAGNOSIS — N1832 Chronic kidney disease, stage 3b: Secondary | ICD-10-CM

## 2021-02-24 DIAGNOSIS — E782 Mixed hyperlipidemia: Secondary | ICD-10-CM

## 2021-02-24 NOTE — Telephone Encounter (Signed)
Patient has cpe scheduled 12/16.Marland Kitchen would like lab orders placed before then so she can have them available at visit  Please let patient know when orders have been submitted [phone]

## 2021-02-26 NOTE — Telephone Encounter (Signed)
ordered

## 2021-02-27 NOTE — Telephone Encounter (Signed)
Message left for patient today.  If she calls back please schedule lab appointment a few days prior to her CPE for Dr. Quay Burow to discuss her lab results.

## 2021-03-07 ENCOUNTER — Ambulatory Visit: Payer: Medicare HMO

## 2021-03-13 ENCOUNTER — Other Ambulatory Visit: Payer: Self-pay | Admitting: Internal Medicine

## 2021-03-14 ENCOUNTER — Telehealth: Payer: Self-pay | Admitting: Internal Medicine

## 2021-03-14 NOTE — Telephone Encounter (Signed)
Patient states she is having cold like symptoms and ears are clogged  Patient states she has taking dayquil/ nyquill and antihistamine otc meds  Patient states otc meds are not helping, patient states she is not in town and is requesting a call back for advice on alternative otc meds she can take for symptoms

## 2021-03-14 NOTE — Telephone Encounter (Signed)
Spoke with patient today. 

## 2021-03-15 ENCOUNTER — Ambulatory Visit (INDEPENDENT_AMBULATORY_CARE_PROVIDER_SITE_OTHER): Payer: Medicare HMO

## 2021-03-15 DIAGNOSIS — Z1211 Encounter for screening for malignant neoplasm of colon: Secondary | ICD-10-CM | POA: Diagnosis not present

## 2021-03-15 DIAGNOSIS — Z0000 Encounter for general adult medical examination without abnormal findings: Secondary | ICD-10-CM

## 2021-03-15 NOTE — Progress Notes (Signed)
I connected with Danielle Fischer today by telephone and verified that I am speaking with the correct person using two identifiers. Location patient: home Location provider: work Persons participating in the virtual visit: patient, provider.   I discussed the limitations, risks, security and privacy concerns of performing an evaluation and management service by telephone and the availability of in person appointments. I also discussed with the patient that there may be a patient responsible charge related to this service. The patient expressed understanding and verbally consented to this telephonic visit.    Interactive audio and video telecommunications were attempted between this provider and patient, however failed, due to patient having technical difficulties OR patient did not have access to video capability.  We continued and completed visit with audio only.  Some vital signs may be absent or patient reported.   Time Spent with patient on telephone encounter: 40 minutes  Subjective:   Danielle Fischer is a 70 y.o. female who presents for Medicare Annual (Subsequent) preventive examination.  Review of Systems     Cardiac Risk Factors include: advanced age (>18men, >33 women);family history of premature cardiovascular disease;hypertension     Objective:    There were no vitals filed for this visit. There is no height or weight on file to calculate BMI.  Advanced Directives 03/15/2021  Does Patient Have a Medical Advance Directive? No  Would patient like information on creating a medical advance directive? No - Patient declined    Current Medications (verified) Outpatient Encounter Medications as of 03/15/2021  Medication Sig   amitriptyline (ELAVIL) 25 MG tablet TAKE 1 TABLET(25 MG) BY MOUTH AT BEDTIME   Calcium Carbonate-Vitamin D (CALCIUM + D PO) Take by mouth 2 (two) times daily.     metoprolol succinate (TOPROL-XL) 50 MG 24 hr tablet TAKE 1 TABLET BY MOUTH DAILY WITH OR  IMMEDIATELY FOLLOWING A MEAL.   valACYclovir (VALTREX) 1000 MG tablet Take 2 tablets (2,000 mg total) by mouth every 12 (twelve) hours as needed (for outbreaks). Take 2 pills x 1 as needed for outbreaks then 2 pills 12 hours later, MDD 4   No facility-administered encounter medications on file as of 03/15/2021.    Allergies (verified) Erythromycin   History: Past Medical History:  Diagnosis Date   Cystocele    Hypertension    Osteopenia    Rectocele    SUI (stress urinary incontinence, female)    Uterine prolapse    Past Surgical History:  Procedure Laterality Date   COLONOSCOPY  2009   negative; Dr Bray, [place]   A/P Repair   Family History  Problem Relation Age of Onset   Hypertension Mother    Osteoporosis Mother    Heart disease Father        MI @ 53   ALS Father    Hypertension Brother    Prostate cancer Brother    Breast cancer Paternal Aunt    Osteoporosis Maternal Grandmother    Stroke Paternal Grandmother    Diabetes Neg Hx    Social History   Socioeconomic History   Marital status: Married    Spouse name: Not on file   Number of children: Not on file   Years of education: Not on file   Highest education level: Not on file  Occupational History   Not on file  Tobacco Use   Smoking status: Former   Smokeless tobacco: Never   Tobacco comments:    smoked ages [age], up to  5 cigarettes/day  Substance and Sexual Activity   Alcohol use: Yes    Alcohol/week: 4.0 standard drinks    Types: 4 Glasses of wine per week    Comment:  socially on weekends   Drug use: No   Sexual activity: Yes    Birth control/protection: Surgical  Other Topics Concern   Not on file  Social History Narrative   No regular exercise   Social Determinants of Health   Financial Resource Strain: Low Risk    Difficulty of Paying Living Expenses: Not hard at all  Food Insecurity: No Food Insecurity   Worried About Charity fundraiser in the  Last Year: Never true   Ran Out of Food in the Last Year: Never true  Transportation Needs: No Transportation Needs   Lack of Transportation (Medical): No   Lack of Transportation (Non-Medical): No  Physical Activity: Sufficiently Active   Days of Exercise per Week: 5 days   Minutes of Exercise per Session: 30 min  Stress: No Stress Concern Present   Feeling of Stress : Not at all  Social Connections: Socially Integrated   Frequency of Communication with Friends and Family: More than three times a week   Frequency of Social Gatherings with Friends and Family: More than three times a week   Attends Religious Services: More than 4 times per year   Active Member of [employer] or Organizations: Yes   Attends Music therapist: More than 4 times per year   Marital Status: Married    Tobacco Counseling Counseling given: Not Answered Tobacco comments: smoked ages [age], up to 5 cigarettes/day   Clinical Intake:  Pre-visit preparation completed: Yes  Pain : No/denies pain     Nutritional Risks: None Diabetes: No  How often do you need to have someone help you when you read instructions, pamphlets, or other written materials from your doctor or pharmacy?: 1 - Never What is the last grade level you completed in school?: Bachelor's Degree in Education  Diabetic? no  Interpreter Needed?: No  Information entered by :: Lisette Abu, LPN   Activities of Daily Living In your present state of health, do you have any difficulty performing the following activities: 03/15/2021  Hearing? N  Vision? N  Difficulty concentrating or making decisions? N  Walking or climbing stairs? N  Dressing or bathing? N  Doing errands, shopping? N  Preparing Food and eating ? N  Using the Toilet? N  In the past six months, have you accidently leaked urine? N  Do you have problems with loss of bowel control? N  Managing your Medications? N  Managing your Finances? N  Housekeeping or  managing your Housekeeping? N  Some recent data might be hidden    Patient Care Team: Binnie Rail, MD as PCP - General (Internal Medicine) Warden Fillers, MD as Consulting Physician (Ophthalmology)  Indicate any recent Medical Services you may have received from other than [facility] providers in the past year (date may be approximate).     Assessment:   This is a routine wellness examination for Danielle Fischer.  Hearing/Vision screen Hearing Screening - Comments:: Patient denied any hearing difficulty.   No hearing aids.  Vision Screening - Comments:: Patient wears corrective glasses/contacts.  Eye exam done annually by: Warden Fillers, MD.  Dietary issues and exercise activities discussed: Current Exercise Habits: Home exercise routine, Type of exercise: walking, Time (Minutes): 30, Frequency (Times/Week): 5, Weekly Exercise (Minutes/Week): 150, Intensity: Moderate, Exercise limited by: None identified  Goals Addressed               This Visit's Progress     Patient Stated (pt-stated)        Patient declined health goal at this time.      Depression Screen PHQ 2/9 Scores 03/15/2021 02/24/2020 07/22/2017 04/12/2016  PHQ - 2 Score 0 0 0 0    Fall Risk Fall Risk  03/15/2021 02/24/2020 04/12/2016  Falls in the past year? 0 0 No  Number falls in past yr: 0 0 -  Injury with Fall? 1 0 -  Risk for fall due to : No Fall Risks No Fall Risks -  Follow up Falls evaluation completed Falls evaluation completed -    FALL RISK PREVENTION PERTAINING TO THE HOME:  Any stairs in or around the home? Yes  If so, are there any without handrails? No  Home free of loose throw rugs in walkways, pet beds, electrical cords, etc? Yes  Adequate lighting in your home to reduce risk of falls? Yes   ASSISTIVE DEVICES UTILIZED TO PREVENT FALLS:  Life alert? No  Use of a cane, walker or w/c? No  Grab bars in the bathroom? Yes  Shower chair or bench in shower? Yes  Elevated toilet seat or a  handicapped toilet? Yes   TIMED UP AND GO:  Was the test performed? No .  Length of time to ambulate 10 feet: n/a sec.   Gait steady and fast without use of assistive device  Cognitive Function: Normal cognitive status assessed by direct observation by this Nurse Health Advisor. No abnormalities found.          Immunizations Immunization History  Administered Date(s) Administered   Fluad Quad(high Dose 65+) 02/24/2020   Influenza, High Dose Seasonal PF 02/21/2018, 02/26/2021   Influenza-Unspecified 01/23/2015, 02/20/2017   PFIZER(Purple Top)SARS-COV-2 Vaccination 05/30/2019, 06/20/2019, 03/13/2020   Pneumococcal Conjugate-13 05/15/2016   Pneumococcal Polysaccharide-23 07/22/2017   Td 04/23/2002   Zoster Recombinat (Shingrix) 02/26/2021    TDAP status: Due, Education has been provided regarding the importance of this vaccine. Advised may receive this vaccine at local pharmacy or Health Dept. Aware to provide a copy of the vaccination record if obtained from local pharmacy or Health Dept. Verbalized acceptance and understanding.  Flu Vaccine status: Up to date  Pneumococcal vaccine status: Up to date  Covid-19 vaccine status: Completed vaccines  Qualifies for Shingles Vaccine? Yes   Zostavax completed No   Shingrix Completed?: No.    Education has been provided regarding the importance of this vaccine. Patient has been advised to call insurance company to determine out of pocket expense if they have not yet received this vaccine. Advised may also receive vaccine at local pharmacy or Health Dept. Verbalized acceptance and understanding. Need second dose.  Screening Tests Health Maintenance  Topic Date Due   TETANUS/TDAP  04/23/2012   DEXA SCAN  Never done   COLONOSCOPY (Pts 45-33yrs Insurance coverage will need to be confirmed)  11/30/2017   MAMMOGRAM  03/24/2020   COVID-19 Vaccine (4 - Booster for Pfizer series) 05/08/2020   Zoster Vaccines- Shingrix (2 of 2) 04/23/2021    Pneumonia Vaccine 56+ Years old  Completed   INFLUENZA VACCINE  Completed   Hepatitis C Screening  Completed   HPV VACCINES  Aged Out    Health Maintenance  Health Maintenance Due  Topic Date Due   TETANUS/TDAP  04/23/2012   DEXA SCAN  Never done   COLONOSCOPY (Pts 45-79yrs Insurance coverage  will need to be confirmed)  11/30/2017   MAMMOGRAM  03/24/2020   COVID-19 Vaccine (4 - Booster for [place] series) 05/08/2020    Colorectal cancer screening: Referral to GI placed 03/15/2021. Pt aware the office will call re: appt.  Mammogram status: Completed 08/16/2020. Repeat every year  Bone density status: never done  Lung Cancer Screening: (Low Dose CT Chest recommended if Age 53-80 years, 30 pack-year currently smoking OR have quit w/in 15years.) does not qualify.   Lung Cancer Screening Referral: no  Additional Screening:  Hepatitis C Screening: does qualify; Completed yes  Vision Screening: Recommended annual ophthalmology exams for early detection of glaucoma and other disorders of the eye. Is the patient up to date with their annual eye exam?  Yes  Who is the provider or what is the name of the office in which the patient attends annual eye exams? Warden Fillers, MD. If pt is not established with a provider, would they like to be referred to a provider to establish care? No .   Dental Screening: Recommended annual dental exams for proper oral hygiene  Community Resource Referral / Chronic Care Management: CRR required this visit?  No   CCM required this visit?  No      Plan:     I have personally reviewed and noted the following in the patient's chart:   Medical and social history Use of alcohol, tobacco or illicit drugs  Current medications and supplements including opioid prescriptions.  Functional ability and status Nutritional status Physical activity Advanced directives List of other physicians Hospitalizations, surgeries, and ER visits in previous 12  months Vitals Screenings to include cognitive, depression, and falls Referrals and appointments  In addition, I have reviewed and discussed with patient certain preventive protocols, quality metrics, and best practice recommendations. A written personalized care plan for preventive services as well as general preventive health recommendations were provided to patient.     Sheral Flow, LPN   [id number]   Nurse Notes:  Patient is cogitatively intact. There were no vitals filed for this visit. There is no height or weight on file to calculate BMI. Patient stated that she has no issues with gait or balance; does not use any assistive devices. Medications reviewed with patient; no opioid use noted.

## 2021-03-17 DIAGNOSIS — H6691 Otitis media, unspecified, right ear: Secondary | ICD-10-CM | POA: Diagnosis not present

## 2021-03-21 ENCOUNTER — Encounter: Payer: Self-pay | Admitting: Internal Medicine

## 2021-03-21 NOTE — Patient Instructions (Addendum)
   You had a steroid injection today.   Medications changes include : prednisone  50 mg daily x 5 days, restart flonase nasal spray.   Your prescription(s) have been submitted to your pharmacy. Please take as directed and contact our office if you believe you are having problem(s) with the medication(s).   A referral was ordered for ENT.    Try calling -  [facility] ENT Dr Benjamine Mola

## 2021-03-21 NOTE — Progress Notes (Signed)
Subjective:    Patient ID: Danielle Fischer, female    DOB: 1950-08-31, 70 y.o.   MRN: [id number]  This visit occurred during the SARS-CoV-2 public health emergency.  Safety protocols were in place, including screening questions prior to the visit, additional usage of staff PPE, and extensive cleaning of exam room while observing appropriate contact time as indicated for disinfecting solutions.    HPI The patient is here for an acute visit.  Right ear clogged  - she was out of town with her grandchildren.  She had a mild cold - no congestion.  She had a little draining from the ear.  Then all of a sudden her right ear felt like it was filling up and it was sore, but no pain.  She had significant decreased hearing in the right ear at that time.  She had no pain and she let it go for a while.  The next week she went to [facility] ( that was last Friday) - dx with ear infection and given Augmentin, which she is still taking.     At this point her hearing is still significantly decreased and her ear is clogged.  She is concerned that she has not been seeing improvement.  She did try Flonase and an over-the-counter decongestant without improvement.  Medications and allergies reviewed with patient and updated if appropriate.  Patient Active Problem List   Diagnosis Date Noted   Herpes simplex 02/24/2020   Vitamin D deficiency 02/05/2019   CKD (chronic kidney disease) stage 3, GFR 30-59 ml/min (HCC) 02/05/2019   Prediabetes 07/19/2017   Chronic neck pain 05/15/2016   Seasonal affective disorder ([place]) 09/08/2012   Hyperlipidemia 04/25/2011   Uterine prolapse    Cystocele    Rectocele    SUI (stress urinary incontinence, female)    Osteopenia    Essential hypertension 11/10/2007    Current Outpatient Medications on File Prior to Visit  Medication Sig Dispense Refill   amitriptyline (ELAVIL) 25 MG tablet TAKE 1 TABLET(25 MG) BY MOUTH AT BEDTIME 90 tablet 1   amoxicillin-clavulanate  (AUGMENTIN) 875-125 MG tablet Take 1 tablet by mouth 2 (two) times daily.     Calcium Carbonate-Vitamin D (CALCIUM + D PO) Take by mouth 2 (two) times daily.       metoprolol succinate (TOPROL-XL) 50 MG 24 hr tablet TAKE 1 TABLET BY MOUTH DAILY WITH OR IMMEDIATELY FOLLOWING A MEAL. 90 tablet 3   valACYclovir (VALTREX) 1000 MG tablet Take 2 tablets (2,000 mg total) by mouth every 12 (twelve) hours as needed (for outbreaks). Take 2 pills x 1 as needed for outbreaks then 2 pills 12 hours later, MDD 4 40 tablet 5   No current facility-administered medications on file prior to visit.    Past Medical History:  Diagnosis Date   Cystocele    Hypertension    Osteopenia    Rectocele    SUI (stress urinary incontinence, female)    Uterine prolapse     Past Surgical History:  Procedure Laterality Date   COLONOSCOPY  2009   negative; Dr Bray, [place]   A/P Repair    Social History   Socioeconomic History   Marital status: Married    Spouse name: Not on file   Number of children: Not on file   Years of education: Not on file   Highest education level: Not on file  Occupational History   Not on file  Tobacco Use  Smoking status: Former   Smokeless tobacco: Never   Tobacco comments:    smoked ages [age], up to 5 cigarettes/day  Substance and Sexual Activity   Alcohol use: Yes    Alcohol/week: 4.0 standard drinks    Types: 4 Glasses of wine per week    Comment:  socially on weekends   Drug use: No   Sexual activity: Yes    Birth control/protection: Surgical  Other Topics Concern   Not on file  Social History Narrative   No regular exercise   Social Determinants of Health   Financial Resource Strain: Low Risk    Difficulty of Paying Living Expenses: Not hard at all  Food Insecurity: No Food Insecurity   Worried About Charity fundraiser in the Last Year: Never true   Ran Out of Food in the Last Year: Never true  Transportation Needs: No  Transportation Needs   Lack of Transportation (Medical): No   Lack of Transportation (Non-Medical): No  Physical Activity: Sufficiently Active   Days of Exercise per Week: 5 days   Minutes of Exercise per Session: 30 min  Stress: No Stress Concern Present   Feeling of Stress : Not at all  Social Connections: Socially Integrated   Frequency of Communication with Friends and Family: More than three times a week   Frequency of Social Gatherings with Friends and Family: More than three times a week   Attends Religious Services: More than 4 times per year   Active Member of [employer] or Organizations: Yes   Attends Music therapist: More than 4 times per year   Marital Status: Married    Family History  Problem Relation Age of Onset   Hypertension Mother    Osteoporosis Mother    Heart disease Father        MI @ 24   ALS Father    Hypertension Brother    Prostate cancer Brother    Breast cancer Paternal Aunt    Osteoporosis Maternal Grandmother    Stroke Paternal Grandmother    Diabetes Neg Hx     Review of Systems  Constitutional:  Negative for fever.  HENT:  Positive for hearing loss. Negative for congestion, ear pain and sore throat.        Right ear clogged  Neurological:  Negative for dizziness and headaches.      Objective:   Vitals:   03/22/21 0808  BP: 118/70  Pulse: 91  Temp: 98.4 F (36.9 C)  SpO2: 97%   BP Readings from Last 3 Encounters:  03/22/21 118/70  02/24/20 126/80  09/03/17 130/90   Wt Readings from Last 3 Encounters:  03/22/21 143 lb (64.9 kg)  02/24/20 142 lb 9.6 oz (64.7 kg)  09/03/17 149 lb (67.6 kg)   Body mass index is 24.55 kg/m.   Physical Exam    GENERAL APPEARANCE: Appears stated age, well appearing, NAD EYES: conjunctiva clear, no icterus HENT: bilateral tympanic membranes and ear canals normal, oropharynx with no erythema or exudates, trachea midline, no cervical or supraclavicular lymphadenopathy LUNGS: Unlabored  breathing, good air entry bilaterally, clear to auscultation without wheeze or crackles CARDIOVASCULAR: Normal S1,S2 , no edema SKIN: Warm, dry      Assessment & Plan:    See Problem List for Assessment and Plan of chronic medical problems.

## 2021-03-22 ENCOUNTER — Encounter: Payer: Self-pay | Admitting: Internal Medicine

## 2021-03-22 ENCOUNTER — Other Ambulatory Visit: Payer: Self-pay

## 2021-03-22 ENCOUNTER — Ambulatory Visit (INDEPENDENT_AMBULATORY_CARE_PROVIDER_SITE_OTHER): Payer: Medicare HMO | Admitting: Internal Medicine

## 2021-03-22 VITALS — BP 118/70 | HR 91 | Temp 98.4°F | Ht 64.0 in | Wt 143.0 lb

## 2021-03-22 DIAGNOSIS — H9191 Unspecified hearing loss, right ear: Secondary | ICD-10-CM | POA: Diagnosis not present

## 2021-03-22 MED ORDER — PREDNISONE 50 MG PO TABS: 50.0000 mg | ORAL_TABLET | Freq: Every day | ORAL | 0 refills | Status: DC

## 2021-03-22 MED ORDER — METHYLPREDNISOLONE ACETATE 80 MG/ML IJ SUSP
80.0000 mg | Freq: Once | INTRAMUSCULAR | Status: AC
  Administered 2021-03-22: 80 mg via INTRAMUSCULAR

## 2021-03-22 NOTE — Assessment & Plan Note (Signed)
Acute Acute onset earlier this month 5 days ago she did go to urgent care and was diagnosed with an ear infection and prescribed Augmentin, but has not seen improvement in her hearing since then and denies ever having ear pain Ear does not look infected, but does look like there is an effusion Advised her to complete the Augmentin I am concerned about her hearing loss Depo-Medrol 80 mg IM x1 Start prednisone tomorrow 50 mg with breakfast-daily x5 days Start Flonase daily Urgent referral to ENT

## 2021-03-28 DIAGNOSIS — H9011 Conductive hearing loss, unilateral, right ear, with unrestricted hearing on the contralateral side: Secondary | ICD-10-CM | POA: Diagnosis not present

## 2021-03-28 DIAGNOSIS — H6981 Other specified disorders of Eustachian tube, right ear: Secondary | ICD-10-CM | POA: Diagnosis not present

## 2021-03-28 DIAGNOSIS — H6501 Acute serous otitis media, right ear: Secondary | ICD-10-CM | POA: Diagnosis not present

## 2021-03-28 DIAGNOSIS — H6121 Impacted cerumen, right ear: Secondary | ICD-10-CM | POA: Diagnosis not present

## 2021-03-30 ENCOUNTER — Telehealth: Payer: Self-pay | Admitting: Internal Medicine

## 2021-03-30 NOTE — Telephone Encounter (Signed)
Danielle Fischer from [facility] called to check on status of order for Bone Density test. Faxed on 03/06/21. Pt is scheduled for 03/31/21 at 1:30p.   Please advise.

## 2021-03-30 NOTE — Telephone Encounter (Signed)
Order was faxed.  Will look for order today and send again.

## 2021-03-31 ENCOUNTER — Other Ambulatory Visit (INDEPENDENT_AMBULATORY_CARE_PROVIDER_SITE_OTHER): Payer: Medicare HMO

## 2021-03-31 DIAGNOSIS — Z0000 Encounter for general adult medical examination without abnormal findings: Secondary | ICD-10-CM | POA: Diagnosis not present

## 2021-03-31 DIAGNOSIS — I10 Essential (primary) hypertension: Secondary | ICD-10-CM | POA: Diagnosis not present

## 2021-03-31 DIAGNOSIS — E782 Mixed hyperlipidemia: Secondary | ICD-10-CM | POA: Diagnosis not present

## 2021-03-31 DIAGNOSIS — R7303 Prediabetes: Secondary | ICD-10-CM

## 2021-03-31 DIAGNOSIS — M8589 Other specified disorders of bone density and structure, multiple sites: Secondary | ICD-10-CM | POA: Diagnosis not present

## 2021-03-31 DIAGNOSIS — E559 Vitamin D deficiency, unspecified: Secondary | ICD-10-CM

## 2021-03-31 DIAGNOSIS — N1832 Chronic kidney disease, stage 3b: Secondary | ICD-10-CM

## 2021-03-31 DIAGNOSIS — Z780 Asymptomatic menopausal state: Secondary | ICD-10-CM | POA: Diagnosis not present

## 2021-03-31 LAB — CBC WITH DIFFERENTIAL/PLATELET
Basophils Absolute: 0 10*3/uL (ref 0.0–0.1)
Basophils Relative: 0.5 % (ref 0.0–3.0)
Eosinophils Absolute: 0.2 10*3/uL (ref 0.0–0.7)
Eosinophils Relative: 1.9 % (ref 0.0–5.0)
HCT: 40 % (ref 36.0–46.0)
Hemoglobin: 13.2 g/dL (ref 12.0–15.0)
Lymphocytes Relative: 35.5 % (ref 12.0–46.0)
Lymphs Abs: 2.8 10*3/uL (ref 0.7–4.0)
MCHC: 33.1 g/dL (ref 30.0–36.0)
MCV: 95.2 fl (ref 78.0–100.0)
Monocytes Absolute: 0.9 10*3/uL (ref 0.1–1.0)
Monocytes Relative: 11.1 % (ref 3.0–12.0)
Neutro Abs: 4 10*3/uL (ref 1.4–7.7)
Neutrophils Relative %: 51 % (ref 43.0–77.0)
Platelets: 274 10*3/uL (ref 150.0–400.0)
RBC: 4.2 Mil/uL (ref 3.87–5.11)
RDW: 13.1 % (ref 11.5–15.5)
WBC: 7.9 10*3/uL (ref 4.0–10.5)

## 2021-03-31 LAB — COMPREHENSIVE METABOLIC PANEL
ALT: 13 U/L (ref 0–35)
AST: 15 U/L (ref 0–37)
Albumin: 4.1 g/dL (ref 3.5–5.2)
Alkaline Phosphatase: 69 U/L (ref 39–117)
BUN: 23 mg/dL (ref 6–23)
CO2: 28 mEq/L (ref 19–32)
Calcium: 9.7 mg/dL (ref 8.4–10.5)
Chloride: 101 mEq/L (ref 96–112)
Creatinine, Ser: 1.45 mg/dL — ABNORMAL HIGH (ref 0.40–1.20)
GFR: 36.66 mL/min — ABNORMAL LOW (ref >60.00)
Glucose, Bld: 105 mg/dL — ABNORMAL HIGH (ref 70–99)
Potassium: 4.7 mEq/L (ref 3.5–5.1)
Sodium: 137 mEq/L (ref 135–145)
Total Bilirubin: 0.8 mg/dL (ref 0.2–1.2)
Total Protein: 7.1 g/dL (ref 6.0–8.3)

## 2021-03-31 LAB — HEMOGLOBIN A1C: Hgb A1c MFr Bld: 5.9 % (ref 4.6–6.5)

## 2021-03-31 LAB — HM DEXA SCAN

## 2021-03-31 LAB — LIPID PANEL
Cholesterol: 209 mg/dL — ABNORMAL HIGH (ref 0–200)
HDL: 79.9 mg/dL (ref >39.00)
LDL Cholesterol: 101 mg/dL — ABNORMAL HIGH (ref 0–99)
NonHDL: 128.63
Total CHOL/HDL Ratio: 3
Triglycerides: 138 mg/dL (ref 0.0–149.0)
VLDL: 27.6 mg/dL (ref 0.0–40.0)

## 2021-03-31 LAB — VITAMIN D 25 HYDROXY (VIT D DEFICIENCY, FRACTURES): VITD: 45.71 ng/mL (ref 30.00–100.00)

## 2021-03-31 LAB — TSH: TSH: 1.52 u[IU]/mL (ref 0.35–5.50)

## 2021-03-31 LAB — HM MAMMOGRAPHY

## 2021-04-03 NOTE — Telephone Encounter (Signed)
Refaxed again today and fax conformation recieved

## 2021-04-06 NOTE — Patient Instructions (Addendum)
Medications changes include :   none   Please followup in 1 year    Health Maintenance, Female Adopting a healthy lifestyle and getting preventive care are important in promoting health and wellness. Ask your health care provider about: The right schedule for you to have regular tests and exams. Things you can do on your own to prevent diseases and keep yourself healthy. What should I know about diet, weight, and exercise? Eat a healthy diet  Eat a diet that includes plenty of vegetables, fruits, low-fat dairy products, and lean protein. Do not eat a lot of foods that are high in solid fats, added sugars, or sodium. Maintain a healthy weight Body mass index (BMI) is used to identify weight problems. It estimates body fat based on height and weight. Your health care provider can help determine your BMI and help you achieve or maintain a healthy weight. Get regular exercise Get regular exercise. This is one of the most important things you can do for your health. Most adults should: Exercise for at least 150 minutes each week. The exercise should increase your heart rate and make you sweat (moderate-intensity exercise). Do strengthening exercises at least twice a week. This is in addition to the moderate-intensity exercise. Spend less time sitting. Even light physical activity can be beneficial. Watch cholesterol and blood lipids Have your blood tested for lipids and cholesterol at 70 years of age, then have this test every 5 years. Have your cholesterol levels checked more often if: Your lipid or cholesterol levels are high. You are older than 70 years of age. You are at high risk for heart disease. What should I know about cancer screening? Depending on your health history and family history, you may need to have cancer screening at various ages. This may include screening for: Breast cancer. Cervical cancer. Colorectal cancer. Skin cancer. Lung cancer. What should I know  about heart disease, diabetes, and high blood pressure? Blood pressure and heart disease High blood pressure causes heart disease and increases the risk of stroke. This is more likely to develop in people who have high blood pressure readings or are overweight. Have your blood pressure checked: Every 3-5 years if you are 8-75 years of age. Every year if you are 95 years old or older. Diabetes Have regular diabetes screenings. This checks your fasting blood sugar level. Have the screening done: Once every three years after age 67 if you are at a normal weight and have a low risk for diabetes. More often and at a younger age if you are overweight or have a high risk for diabetes. What should I know about preventing infection? Hepatitis B If you have a higher risk for hepatitis B, you should be screened for this virus. Talk with your health care provider to find out if you are at risk for hepatitis B infection. Hepatitis C Testing is recommended for: Everyone born from [birth year] through 1965. Anyone with known risk factors for hepatitis C. Sexually transmitted infections (STIs) Get screened for STIs, including gonorrhea and chlamydia, if: You are sexually active and are younger than 70 years of age. You are older than 70 years of age and your health care provider tells you that you are at risk for this type of infection. Your sexual activity has changed since you were last screened, and you are at increased risk for chlamydia or gonorrhea. Ask your health care provider if you are at risk. Ask your health care provider about whether you  are at high risk for HIV. Your health care provider may recommend a prescription medicine to help prevent HIV infection. If you choose to take medicine to prevent HIV, you should first get tested for HIV. You should then be tested every 3 months for as long as you are taking the medicine. Pregnancy If you are about to stop having your period (premenopausal) and you  may become pregnant, seek counseling before you get pregnant. Take 400 to 800 micrograms (mcg) of folic acid every day if you become pregnant. Ask for birth control (contraception) if you want to prevent pregnancy. Osteoporosis and menopause Osteoporosis is a disease in which the bones lose minerals and strength with aging. This can result in bone fractures. If you are 71 years old or older, or if you are at risk for osteoporosis and fractures, ask your health care provider if you should: Be screened for bone loss. Take a calcium or vitamin D supplement to lower your risk of fractures. Be given hormone replacement therapy (HRT) to treat symptoms of menopause. Follow these instructions at home: Alcohol use Do not drink alcohol if: Your health care provider tells you not to drink. You are pregnant, may be pregnant, or are planning to become pregnant. If you drink alcohol: Limit how much you have to: 0-1 drink a day. Know how much alcohol is in your drink. In the [place] one drink equals one 12 oz bottle of beer (355 mL), one 5 oz glass of wine (148 mL), or one 1 oz glass of hard liquor (44 mL). Lifestyle Do not use any products that contain nicotine or tobacco. These products include cigarettes, chewing tobacco, and vaping devices, such as e-cigarettes. If you need help quitting, ask your health care provider. Do not use street drugs. Do not share needles. Ask your health care provider for help if you need support or information about quitting drugs. General instructions Schedule regular health, dental, and eye exams. Stay current with your vaccines. Tell your health care provider if: You often feel depressed. You have ever been abused or do not feel safe at home. Summary Adopting a healthy lifestyle and getting preventive care are important in promoting health and wellness. Follow your health care provider's instructions about healthy diet, exercising, and getting tested or screened for  diseases. Follow your health care provider's instructions on monitoring your cholesterol and blood pressure. This information is not intended to replace advice given to you by your health care provider. Make sure you discuss any questions you have with your health care provider. Document Revised: 08/29/2020 Document Reviewed: 08/29/2020 Elsevier Patient Education  [place].

## 2021-04-06 NOTE — Progress Notes (Signed)
Subjective:    Patient ID: Danielle Fischer, female    DOB: [birth year], 70 y.o.   MRN: [id number]   This visit occurred during the SARS-CoV-2 public health emergency.  Safety protocols were in place, including screening questions prior to the visit, additional usage of staff PPE, and extensive cleaning of exam room while observing appropriate contact time as indicated for disinfecting solutions.    HPI She is here for a physical exam.   She saw ENT.  Her ear is better.  She does intermittently pop her ear and there might be a slight amount of fluid still there, but it is much better.  She has no concerns or questions.  Medications and allergies reviewed with patient and updated if appropriate.  Patient Active Problem List   Diagnosis Date Noted   Decreased hearing of right ear 03/22/2021   Herpes simplex 02/24/2020   Vitamin D deficiency 02/05/2019   CKD (chronic kidney disease) stage 3, GFR 30-59 ml/min (HCC) 02/05/2019   Prediabetes 07/19/2017   Chronic neck pain 05/15/2016   Seasonal affective disorder ([place]) 09/08/2012   Hyperlipidemia 04/25/2011   Uterine prolapse    Cystocele    Rectocele    SUI (stress urinary incontinence, female)    Osteopenia    Essential hypertension 11/10/2007    Current Outpatient Medications on File Prior to Visit  Medication Sig Dispense Refill   amitriptyline (ELAVIL) 25 MG tablet TAKE 1 TABLET(25 MG) BY MOUTH AT BEDTIME 90 tablet 1   Calcium Carbonate-Vitamin D (CALCIUM + D PO) Take by mouth 2 (two) times daily.       metoprolol succinate (TOPROL-XL) 50 MG 24 hr tablet TAKE 1 TABLET BY MOUTH DAILY WITH OR IMMEDIATELY FOLLOWING A MEAL. 90 tablet 3   valACYclovir (VALTREX) 1000 MG tablet Take 2 tablets (2,000 mg total) by mouth every 12 (twelve) hours as needed (for outbreaks). Take 2 pills x 1 as needed for outbreaks then 2 pills 12 hours later, MDD 4 40 tablet 5   No current facility-administered medications on file prior to visit.     Past Medical History:  Diagnosis Date   Cystocele    Hypertension    Osteopenia    Rectocele    SUI (stress urinary incontinence, female)    Uterine prolapse     Past Surgical History:  Procedure Laterality Date   COLONOSCOPY  2009   negative; Dr Bray, [place]   A/P Repair    Social History   Socioeconomic History   Marital status: Married    Spouse name: Not on file   Number of children: Not on file   Years of education: Not on file   Highest education level: Not on file  Occupational History   Not on file  Tobacco Use   Smoking status: Former   Smokeless tobacco: Never   Tobacco comments:    smoked ages [age], up to 5 cigarettes/day  Substance and Sexual Activity   Alcohol use: Yes    Alcohol/week: 4.0 standard drinks    Types: 4 Glasses of wine per week    Comment:  socially on weekends   Drug use: No   Sexual activity: Yes    Birth control/protection: Surgical  Other Topics Concern   Not on file  Social History Narrative   No regular exercise   Social Determinants of Health   Financial Resource Strain: Low Risk    Difficulty of Paying Living Expenses: Not hard at  all  Food Insecurity: No Food Insecurity   Worried About Charity fundraiser in the Last Year: Never true   Ran Out of Food in the Last Year: Never true  Transportation Needs: No Transportation Needs   Lack of Transportation (Medical): No   Lack of Transportation (Non-Medical): No  Physical Activity: Sufficiently Active   Days of Exercise per Week: 5 days   Minutes of Exercise per Session: 30 min  Stress: No Stress Concern Present   Feeling of Stress : Not at all  Social Connections: Socially Integrated   Frequency of Communication with Friends and Family: More than three times a week   Frequency of Social Gatherings with Friends and Family: More than three times a week   Attends Religious Services: More than 4 times per year   Active Member of [employer]  or Organizations: Yes   Attends Music therapist: More than 4 times per year   Marital Status: Married    Family History  Problem Relation Age of Onset   Hypertension Mother    Osteoporosis Mother    Heart disease Father        MI @ 52   ALS Father    Hypertension Brother    Prostate cancer Brother    Breast cancer Paternal Aunt    Osteoporosis Maternal Grandmother    Stroke Paternal Grandmother    Diabetes Neg Hx     Review of Systems  Constitutional:  Negative for chills and fever.  HENT:  Positive for postnasal drip.   Eyes:  Negative for visual disturbance.  Respiratory:  Negative for cough, shortness of breath and wheezing.   Cardiovascular:  Negative for chest pain, palpitations and leg swelling.  Gastrointestinal:  Negative for abdominal pain, blood in stool, constipation, diarrhea and nausea.       No gerd  Genitourinary:  Negative for dysuria and hematuria.  Musculoskeletal:  Negative for arthralgias and back pain.  Skin:  Negative for color change and rash.  Neurological:  Negative for dizziness, light-headedness and headaches.  Psychiatric/Behavioral:  Negative for dysphoric mood. The patient is not nervous/anxious.       Objective:   Vitals:   04/07/21 0814  BP: 124/80  Pulse: 70  Temp: 98.1 F (36.7 C)  SpO2: 99%   Filed Weights   04/07/21 0814  Weight: 143 lb (64.9 kg)   Body mass index is 24.55 kg/m.  BP Readings from Last 3 Encounters:  04/07/21 124/80  03/22/21 118/70  02/24/20 126/80    Wt Readings from Last 3 Encounters:  04/07/21 143 lb (64.9 kg)  03/22/21 143 lb (64.9 kg)  02/24/20 142 lb 9.6 oz (64.7 kg)    Depression screen [facility] 2/9 03/15/2021 02/24/2020 07/22/2017 04/12/2016  Decreased Interest 0 0 0 0  Down, Depressed, Hopeless 0 0 0 0  PHQ - 2 Score 0 0 0 0     No flowsheet data found.     Physical Exam Constitutional: She appears well-developed and well-nourished. No distress.  HENT:  Head: Normocephalic  and atraumatic.  Right Ear: External ear normal. Normal ear canal and TM Left Ear: External ear normal.  Normal ear canal and TM Mouth/Throat: Oropharynx is clear and moist.  Eyes: Conjunctivae and EOM are normal.  Neck: Neck supple. No tracheal deviation present. No thyromegaly present.  No carotid bruit  Cardiovascular: Normal rate, regular rhythm and normal heart sounds.   No murmur heard.  No edema. Pulmonary/Chest: Effort normal and breath sounds normal.  No respiratory distress. She has no wheezes. She has no rales.  Breast: deferred   Abdominal: Soft. She exhibits no distension. There is no tenderness.  Lymphadenopathy: She has no cervical adenopathy.  Skin: Skin is warm and dry. She is not diaphoretic.  Psychiatric: She has a normal mood and affect. Her behavior is normal.     Lab Results  Component Value Date   WBC 7.9 03/31/2021   HGB 13.2 03/31/2021   HCT 40.0 03/31/2021   PLT 274.0 03/31/2021   GLUCOSE 105 (H) 03/31/2021   CHOL 209 (H) 03/31/2021   TRIG 138.0 03/31/2021   HDL 79.90 03/31/2021   LDLDIRECT 115.4 04/25/2011   LDLCALC 101 (H) 03/31/2021   ALT 13 03/31/2021   AST 15 03/31/2021   NA 137 03/31/2021   K 4.7 03/31/2021   CL 101 03/31/2021   CREATININE 1.45 (H) 03/31/2021   BUN 23 03/31/2021   CO2 28 03/31/2021   TSH 1.52 03/31/2021   HGBA1C 5.9 03/31/2021         Assessment & Plan:   Physical exam: Screening blood work  ordered Exercise  regular Weight  normal Substance abuse  none   Reviewed recommended immunizations.   Health Maintenance  Topic Date Due   DEXA SCAN  Never done   COLONOSCOPY (Pts 45-75yrs Insurance coverage will need to be confirmed)  11/30/2017   MAMMOGRAM  03/24/2020   COVID-19 Vaccine (4 - Booster for Pfizer series) 04/23/2021 (Originally 05/08/2020)   TETANUS/TDAP  04/07/2022 (Originally 04/23/2012)   Zoster Vaccines- Shingrix (2 of 2) 04/23/2021   Pneumonia Vaccine 99+ Years old  Completed   INFLUENZA VACCINE   Completed   Hepatitis C Screening  Completed   HPV VACCINES  Aged Out          See Problem List for Assessment and Plan of chronic medical problems.

## 2021-04-07 ENCOUNTER — Encounter: Payer: Self-pay | Admitting: Internal Medicine

## 2021-04-07 ENCOUNTER — Other Ambulatory Visit: Payer: Self-pay

## 2021-04-07 ENCOUNTER — Ambulatory Visit (INDEPENDENT_AMBULATORY_CARE_PROVIDER_SITE_OTHER): Payer: Medicare HMO | Admitting: Internal Medicine

## 2021-04-07 VITALS — BP 124/80 | HR 70 | Temp 98.1°F | Ht 64.0 in | Wt 143.0 lb

## 2021-04-07 DIAGNOSIS — Z0000 Encounter for general adult medical examination without abnormal findings: Secondary | ICD-10-CM

## 2021-04-07 DIAGNOSIS — Z1211 Encounter for screening for malignant neoplasm of colon: Secondary | ICD-10-CM

## 2021-04-07 DIAGNOSIS — M8589 Other specified disorders of bone density and structure, multiple sites: Secondary | ICD-10-CM | POA: Diagnosis not present

## 2021-04-07 DIAGNOSIS — H9191 Unspecified hearing loss, right ear: Secondary | ICD-10-CM

## 2021-04-07 DIAGNOSIS — I10 Essential (primary) hypertension: Secondary | ICD-10-CM

## 2021-04-07 DIAGNOSIS — E782 Mixed hyperlipidemia: Secondary | ICD-10-CM | POA: Diagnosis not present

## 2021-04-07 DIAGNOSIS — M542 Cervicalgia: Secondary | ICD-10-CM | POA: Diagnosis not present

## 2021-04-07 DIAGNOSIS — R7303 Prediabetes: Secondary | ICD-10-CM | POA: Diagnosis not present

## 2021-04-07 DIAGNOSIS — E559 Vitamin D deficiency, unspecified: Secondary | ICD-10-CM

## 2021-04-07 DIAGNOSIS — N1832 Chronic kidney disease, stage 3b: Secondary | ICD-10-CM | POA: Diagnosis not present

## 2021-04-07 DIAGNOSIS — G8929 Other chronic pain: Secondary | ICD-10-CM

## 2021-04-07 NOTE — Assessment & Plan Note (Signed)
Chronic Well-controlled Regular exercise and healthy diet encouraged Check lipid panel  Diet controlled

## 2021-04-07 NOTE — Assessment & Plan Note (Signed)
Chronic Taking vitamin D daily Check vitamin D level  

## 2021-04-07 NOTE — Assessment & Plan Note (Addendum)
Chronic Done at [facility] on 03/31/21-still needs referral sent over, which we can do Continue regular exercise Continue calcium and vitamin D-we will check vitamin D level

## 2021-04-07 NOTE — Assessment & Plan Note (Addendum)
Chronic Following with nephrology-sees them next in April Blood pressure well controlled Not taking any NSAIDs Drinks plenty of fluids throughout the day CMP

## 2021-04-07 NOTE — Assessment & Plan Note (Signed)
Chronic Controlled Continue amitriptyline 25 mg daily

## 2021-04-07 NOTE — Assessment & Plan Note (Signed)
Chronic Check a1c Low sugar / carb diet Stressed regular exercise  

## 2021-04-07 NOTE — Assessment & Plan Note (Signed)
Chronic BP well controlled Continue metoprolol XL 50 mg daily cmp

## 2021-04-07 NOTE — Assessment & Plan Note (Signed)
Improved Hearing and discomfort in right ear is improved She did see ENT She will continue to pop her ear intermittently throughout the day

## 2021-04-25 ENCOUNTER — Encounter: Payer: Self-pay | Admitting: Internal Medicine

## 2021-08-29 DIAGNOSIS — N1832 Chronic kidney disease, stage 3b: Secondary | ICD-10-CM | POA: Diagnosis not present

## 2021-08-29 DIAGNOSIS — Z1231 Encounter for screening mammogram for malignant neoplasm of breast: Secondary | ICD-10-CM | POA: Diagnosis not present

## 2021-08-29 LAB — HM MAMMOGRAPHY

## 2021-09-07 ENCOUNTER — Encounter: Payer: Self-pay | Admitting: Internal Medicine

## 2021-09-07 DIAGNOSIS — N1832 Chronic kidney disease, stage 3b: Secondary | ICD-10-CM | POA: Diagnosis not present

## 2021-09-07 DIAGNOSIS — E559 Vitamin D deficiency, unspecified: Secondary | ICD-10-CM | POA: Diagnosis not present

## 2021-09-07 DIAGNOSIS — I129 Hypertensive chronic kidney disease with stage 1 through stage 4 chronic kidney disease, or unspecified chronic kidney disease: Secondary | ICD-10-CM | POA: Diagnosis not present

## 2021-09-07 DIAGNOSIS — R7303 Prediabetes: Secondary | ICD-10-CM | POA: Diagnosis not present

## 2021-09-07 NOTE — Progress Notes (Signed)
Outside notes received. Information abstracted. Notes sent to scan.  

## 2021-09-08 ENCOUNTER — Telehealth: Payer: Self-pay | Admitting: *Deleted

## 2021-09-08 NOTE — Telephone Encounter (Signed)
Called patient and left message for patient to call office to schedule her medicare AWV  Last AWV 03/15/2021

## 2021-09-09 ENCOUNTER — Other Ambulatory Visit: Payer: Self-pay | Admitting: Internal Medicine

## 2021-09-20 ENCOUNTER — Other Ambulatory Visit: Payer: Self-pay | Admitting: Internal Medicine

## 2021-11-09 ENCOUNTER — Encounter: Payer: Self-pay | Admitting: Gastroenterology

## 2021-11-09 NOTE — Telephone Encounter (Signed)
error 

## 2021-11-14 ENCOUNTER — Telehealth: Payer: Self-pay | Admitting: *Deleted

## 2021-11-14 NOTE — Telephone Encounter (Signed)
Attempted to call pt. X 2 message left with call back # to return call by 5 p.m. to reschedule pre-visit or upcoming procedure on 11/28/21 will be cancelled.

## 2021-11-14 NOTE — Telephone Encounter (Signed)
Patient returned call and rescheduled pre-visit and procedure date.

## 2021-11-20 ENCOUNTER — Ambulatory Visit (AMBULATORY_SURGERY_CENTER): Payer: Self-pay

## 2021-11-20 ENCOUNTER — Other Ambulatory Visit: Payer: Self-pay

## 2021-11-20 VITALS — Ht 64.0 in | Wt 145.0 lb

## 2021-11-20 DIAGNOSIS — Z1211 Encounter for screening for malignant neoplasm of colon: Secondary | ICD-10-CM

## 2021-11-20 MED ORDER — NA SULFATE-K SULFATE-MG SULF 17.5-3.13-1.6 GM/177ML PO SOLN: 1.0000 | Freq: Once | ORAL | 0 refills | Status: AC

## 2021-11-20 NOTE — Progress Notes (Signed)
Denies allergies to eggs or soy products. Denies complication of anesthesia or sedation. Denies use of weight loss medication. Denies use of O2.   Emmi instructions given for colonoscopy.  

## 2021-11-28 ENCOUNTER — Encounter: Payer: BC Managed Care – PPO | Admitting: Gastroenterology

## 2021-12-07 ENCOUNTER — Ambulatory Visit (AMBULATORY_SURGERY_CENTER): Payer: PPO | Admitting: Gastroenterology

## 2021-12-07 ENCOUNTER — Telehealth: Payer: Self-pay | Admitting: Gastroenterology

## 2021-12-07 ENCOUNTER — Encounter: Payer: Self-pay | Admitting: Gastroenterology

## 2021-12-07 VITALS — BP 116/51 | HR 69 | Temp 96.8°F | Resp 15 | Ht 64.0 in | Wt 145.0 lb

## 2021-12-07 DIAGNOSIS — K5730 Diverticulosis of large intestine without perforation or abscess without bleeding: Secondary | ICD-10-CM | POA: Diagnosis not present

## 2021-12-07 DIAGNOSIS — D122 Benign neoplasm of ascending colon: Secondary | ICD-10-CM | POA: Diagnosis not present

## 2021-12-07 DIAGNOSIS — D125 Benign neoplasm of sigmoid colon: Secondary | ICD-10-CM

## 2021-12-07 DIAGNOSIS — K635 Polyp of colon: Secondary | ICD-10-CM

## 2021-12-07 DIAGNOSIS — Z1211 Encounter for screening for malignant neoplasm of colon: Secondary | ICD-10-CM | POA: Diagnosis not present

## 2021-12-07 MED ORDER — SODIUM CHLORIDE 0.9 % IV SOLN: 500.0000 mL | Freq: Once | INTRAVENOUS | Status: DC

## 2021-12-07 NOTE — Telephone Encounter (Signed)
Patient's spouse called.  Patient had procedure today and is having difficulty swallowing, her throat is raw, and she cannot eat anything.  She thought this unusual as she's had these procedures before and they never affected her like this.  Please call patient's husband on her cell # [phone].  Thank you.

## 2021-12-07 NOTE — Telephone Encounter (Signed)
Thank you for helping her over the phone.  The reported symptoms seem completely unrelated to the colonoscopy earlier today.  Agree with recommendations as outlined.  I will also update our on-call physician in case there are any overnight calls.

## 2021-12-07 NOTE — Progress Notes (Signed)
GASTROENTEROLOGY PROCEDURE H&P NOTE   Primary Care Physician: Binnie Rail, MD    Reason for Procedure:  Colon Cancer screening  Plan:    Colonoscopy  Patient is appropriate for endoscopic procedure(s) in the ambulatory ([place]) setting.  The nature of the procedure, as well as the risks, benefits, and alternatives were carefully and thoroughly reviewed with the patient. Ample time for discussion and questions allowed. The patient understood, was satisfied, and agreed to proceed.     HPI: Danielle Fischer is a 71 y.o. female who presents for colonoscopy for Colon Cancer screening.  No active GI symptoms.  Last colonoscpoy was 11/2007 and normal . No known family history of colon cancer or related malignancy.  Patient is otherwise without complaints or active issues today.  Past Medical History:  Diagnosis Date   Blood transfusion without reported diagnosis    Cataract    Chronic kidney disease    Cystocele    Heart murmur    Hypertension    Osteopenia    Rectocele    SUI (stress urinary incontinence, female)    Uterine prolapse     Past Surgical History:  Procedure Laterality Date   COLONOSCOPY  2009   negative; Dr Bray, [place]   A/P Repair    Prior to Admission medications   Medication Sig Start Date End Date Taking? Authorizing Provider  amitriptyline (ELAVIL) 25 MG tablet TAKE 1 TABLET(25 MG) BY MOUTH AT BEDTIME 09/11/21  Yes Burns, Claudina Lick, MD  Calcium Carbonate-Vitamin D (CALCIUM + D PO) Take by mouth 2 (two) times daily.     Yes [provider]  metoprolol succinate (TOPROL-XL) 50 MG 24 hr tablet TAKE 1 TABLET BY MOUTH DAILY WITH OR IMMEDIATELY FOLLOWING A MEAL 09/20/21  Yes Burns, Claudina Lick, MD  valACYclovir (VALTREX) 1000 MG tablet Take 2 tablets (2,000 mg total) by mouth every 12 (twelve) hours as needed (for outbreaks). Take 2 pills x 1 as needed for outbreaks then 2 pills 12 hours later, MDD 4 02/24/20   Binnie Rail,  MD    Current Outpatient Medications  Medication Sig Dispense Refill   amitriptyline (ELAVIL) 25 MG tablet TAKE 1 TABLET(25 MG) BY MOUTH AT BEDTIME 90 tablet 1   Calcium Carbonate-Vitamin D (CALCIUM + D PO) Take by mouth 2 (two) times daily.       metoprolol succinate (TOPROL-XL) 50 MG 24 hr tablet TAKE 1 TABLET BY MOUTH DAILY WITH OR IMMEDIATELY FOLLOWING A MEAL 90 tablet 3   valACYclovir (VALTREX) 1000 MG tablet Take 2 tablets (2,000 mg total) by mouth every 12 (twelve) hours as needed (for outbreaks). Take 2 pills x 1 as needed for outbreaks then 2 pills 12 hours later, MDD 4 40 tablet 5   Current Facility-Administered Medications  Medication Dose Route Frequency Provider Last Rate Last Admin   0.9 %  sodium chloride infusion  500 mL Intravenous Once Jullie Arps V, DO        Allergies as of 12/07/2021 - Review Complete 12/07/2021  Allergen Reaction Noted   Erythromycin      Family History  Problem Relation Age of Onset   Hypertension Mother    Osteoporosis Mother    Heart disease Father        MI @ 21   ALS Father    Hypertension Brother    Prostate cancer Brother    Breast cancer Paternal Aunt    Osteoporosis Maternal Grandmother  Stroke Paternal Grandmother    Diabetes Neg Hx    Colon cancer Neg Hx    Esophageal cancer Neg Hx    Rectal cancer Neg Hx    Stomach cancer Neg Hx     Social History   Socioeconomic History   Marital status: Married    Spouse name: Not on file   Number of children: Not on file   Years of education: Not on file   Highest education level: Not on file  Occupational History   Not on file  Tobacco Use   Smoking status: Former   Smokeless tobacco: Never   Tobacco comments:    smoked ages [age], up to 5 cigarettes/day  Substance and Sexual Activity   Alcohol use: Yes    Alcohol/week: 4.0 standard drinks of alcohol    Types: 4 Glasses of wine per week    Comment:  socially on weekends   Drug use: No   Sexual activity: Yes     Birth control/protection: Surgical  Other Topics Concern   Not on file  Social History Narrative   No regular exercise   Social Determinants of Health   Financial Resource Strain: Low Risk  (03/15/2021)   Overall Financial Resource Strain (CARDIA)    Difficulty of Paying Living Expenses: Not hard at all  Food Insecurity: No Food Insecurity (03/15/2021)   Hunger Vital Sign    Worried About Running Out of Food in the Last Year: Never true    Ran Out of Food in the Last Year: Never true  Transportation Needs: No Transportation Needs (03/15/2021)   PRAPARE - Hydrologist (Medical): No    Lack of Transportation (Non-Medical): No  Physical Activity: Sufficiently Active (03/15/2021)   Exercise Vital Sign    Days of Exercise per Week: 5 days    Minutes of Exercise per Session: 30 min  Stress: No Stress Concern Present (03/15/2021)   [place]    Feeling of Stress : Not at all  Social Connections: [place] (03/15/2021)   Social Connection and Isolation Panel [NHANES]    Frequency of Communication with Friends and Family: More than three times a week    Frequency of Social Gatherings with Friends and Family: More than three times a week    Attends Religious Services: More than 4 times per year    Active Member of [employer] or Organizations: Yes    Attends Archivist Meetings: More than 4 times per year    Marital Status: Married  Human resources officer Violence: Not At Risk (03/15/2021)   Humiliation, Afraid, Rape, and Kick questionnaire    Fear of Current or Ex-Partner: No    Emotionally Abused: No    Physically Abused: No    Sexually Abused: No    Physical Exam: Vital signs in last 24 hours: '@BP'$  123/81   Pulse 89   Temp (!) 96.8 F (36 C) (Temporal)   Ht '5\' 4"'$  (1.626 m)   Wt 145 lb (65.8 kg)   SpO2 100%   BMI 24.89 kg/m  GEN: NAD EYE: Sclerae anicteric ENT: MMM CV:  Non-tachycardic Pulm: CTA b/l GI: Soft, NT/ND NEURO:  Alert & Oriented x 3   Gerrit Heck, DO [facility]   12/07/2021 11:36 AM

## 2021-12-07 NOTE — Op Note (Signed)
[place] Patient Name: Danielle Fischer Procedure Date: 12/07/2021 11:37 AM MRN: [id number] Endoscopist: Gerrit Heck , MD Age: 71 Referring MD:  Date of Birth: [birth year] Gender: Female Account #: [id number] Procedure:                Colonoscopy Indications:              Screening for colorectal malignant neoplasm (last                            colonoscopy was more than 10 years ago) Medicines:                Monitored Anesthesia Care Procedure:                Pre-Anesthesia Assessment:                           - Prior to the procedure, a History and Physical                            was performed, and patient medications and                            allergies were reviewed. The patient's tolerance of                            previous anesthesia was also reviewed. The risks                            and benefits of the procedure and the sedation                            options and risks were discussed with the patient.                            All questions were answered, and informed consent                            was obtained. Prior Anticoagulants: The patient has                            taken no previous anticoagulant or antiplatelet                            agents. ASA Grade Assessment: I - A normal, healthy                            patient. After reviewing the risks and benefits,                            the patient was deemed in satisfactory condition to                            undergo the procedure.  After obtaining informed consent, the colonoscope                            was passed under direct vision. Throughout the                            procedure, the patient's blood pressure, pulse, and                            oxygen saturations were monitored continuously. The                            CF [id number] #[id number] was introduced through the anus                            and advanced to the the terminal  ileum. The                            colonoscopy was technically difficult and complex                            due to a tortuous colon. Successful completion of                            the procedure was aided by applying abdominal                            pressure. The patient tolerated the procedure well.                            The quality of the bowel preparation was good. The                            terminal ileum, ileocecal valve, appendiceal                            orifice, and rectum were photographed. Scope In: 11:44:44 AM Scope Out: 12:08:27 PM Scope Withdrawal Time: 0 hours 13 minutes 43 seconds  Total Procedure Duration: 0 hours 23 minutes 43 seconds  Findings:                 The perianal and digital rectal examinations were                            normal.                           Two semi-sessile polyps were found in the ascending                            colon. The polyps were 6 to 8 mm in size, with                            adherent mucus cap. These polyps were removed  with                            a cold snare. Resection and retrieval were                            complete. Estimated blood loss was minimal.                           A 6 mm polyp was found in the sigmoid colon. The                            polyp was sessile. The polyp was removed with a                            cold snare. Resection and retrieval were complete.                            Estimated blood loss was minimal.                           A few small-mouthed diverticula were found in the                            sigmoid colon.                           The sigmoid colon was significantly tortuous.                            Advancing the scope required using manual pressure.                           The retroflexed view of the distal rectum and anal                            verge was normal and showed no anal or rectal                             abnormalities.                           The terminal ileum appeared normal. Complications:            No immediate complications. Estimated Blood Loss:     Estimated blood loss was minimal. Impression:               - Two 6 to 8 mm polyps in the ascending colon,                            removed with a cold snare. Resected and retrieved.                           - One 6 mm polyp in the sigmoid colon, removed with  a cold snare. Resected and retrieved.                           - Diverticulosis in the sigmoid colon.                           - Tortuous colon.                           - The distal rectum and anal verge are normal on                            retroflexion view.                           - The examined portion of the ileum was normal. Recommendation:           - Patient has a contact number available for                            emergencies. The signs and symptoms of potential                            delayed complications were discussed with the                            patient. Return to normal activities tomorrow.                            Written discharge instructions were provided to the                            patient.                           - Resume previous diet.                           - Continue present medications.                           - Await pathology results.                           - Repeat colonoscopy for surveillance based on                            pathology results.                           - Return to GI office PRN. Gerrit Heck, MD 12/07/2021 12:17:18 PM

## 2021-12-07 NOTE — Patient Instructions (Signed)
Read all of the handouts given to you by your recover room nurse.  YOU HAD AN ENDOSCOPIC PROCEDURE TODAY AT [facility]:   Refer to the procedure report that was given to you for any specific questions about what was found during the examination.  If the procedure report does not answer your questions, please call your gastroenterologist to clarify.  If you requested that your care partner not be given the details of your procedure findings, then the procedure report has been included in a sealed envelope for you to review at your convenience later.  YOU SHOULD EXPECT: Some feelings of bloating in the abdomen. Passage of more gas than usual.  Walking can help get rid of the air that was put into your GI tract during the procedure and reduce the bloating. If you had a lower endoscopy (such as a colonoscopy or flexible sigmoidoscopy) you may notice spotting of blood in your stool or on the toilet paper. If you underwent a bowel prep for your procedure, you may not have a normal bowel movement for a few days.  Please Note:  You might notice some irritation and congestion in your nose or some drainage.  This is from the oxygen used during your procedure.  There is no need for concern and it should clear up in a day or so.  SYMPTOMS TO REPORT IMMEDIATELY:  Following lower endoscopy (colonoscopy or flexible sigmoidoscopy):  Excessive amounts of blood in the stool  Significant tenderness or worsening of abdominal pains  Swelling of the abdomen that is new, acute  Fever of 100F or higher    For urgent or emergent issues, a gastroenterologist can be reached at any hour by calling [phone]. Do not use MyChart messaging for urgent concerns.    DIET:  We do recommend a small meal at first, but then you may proceed to your regular diet.  Drink plenty of fluids but you should avoid alcoholic beverages for 24 hours. Try to eat a high fiber diet, and drink plenty of water.  ACTIVITY:   You should plan to take it easy for the rest of today and you should NOT DRIVE or use heavy machinery until tomorrow (because of the sedation medicines used during the test).    FOLLOW UP: Our staff will call the number listed on your records the next business day following your procedure.  We will call around 7:15- 8:00 am to check on you and address any questions or concerns that you may have regarding the information given to you following your procedure. If we do not reach you, we will leave a message.  If you develop any symptoms (ie: fever, flu-like symptoms, shortness of breath, cough etc.) before then, please call [phone].  If you test positive for Covid 19 in the 2 weeks post procedure, please call and report this information to [place].    If any biopsies were taken you will be contacted by phone or by letter within the next 1-3 weeks.  Please call us at [phone] if you have not heard about the biopsies in 3 weeks.    SIGNATURES/CONFIDENTIALITY: You and/or your care partner have signed paperwork which will be entered into your electronic medical record.  These signatures attest to the fact that that the information above on your After Visit Summary has been reviewed and is understood.  Full responsibility of the confidentiality of this discharge information lies with you and/or your care-partner.

## 2021-12-07 NOTE — Progress Notes (Signed)
Pt's states no medical or surgical changes since previsit or office visit. 

## 2021-12-07 NOTE — Telephone Encounter (Signed)
Patient called and is still having a terrible sore throat which feels raw and unable to eat. She tried to eat a popsicle without success and wishes to eat but can not. Her husband says that she cannot talk and says this has never happened before. MD was called and made aware of symptoms.MD stated that nothing was introduced into the oral airway during procedure and believes that symptoms are unrelated to procedure.  MD advised patient to go to urgent care and have throat checked out. Patients husband agreed.

## 2021-12-07 NOTE — Progress Notes (Signed)
Report to pacu rn. Vss. Care resumed by rn. 

## 2021-12-07 NOTE — Progress Notes (Signed)
Called to room to assist during endoscopic procedure.  Patient ID and intended procedure confirmed with present staff. Received instructions for my participation in the procedure from the performing physician.  

## 2021-12-08 ENCOUNTER — Telehealth: Payer: Self-pay

## 2021-12-08 NOTE — Telephone Encounter (Signed)
Left message

## 2021-12-08 NOTE — Telephone Encounter (Signed)
Returned patient call.  Patient wants to speak to the physician about possible allergic reaction to the propofol administered in the procedure after speaking with her daughter who is a PA.  I asked the patient if she went to urgent care as recommended and she stated she did not.  She is not happy that there is no explanation for a sore throat following a colonoscopy.  I expleined the oxygen administered during the procedure can dry the nasal and esophageal passages causing dry and sore throat.  Patient insists something had to have caused this because she knows she "didn't catch a severe case of strep throat or covid".  Patient ultimately ended call in frustration.

## 2021-12-08 NOTE — Telephone Encounter (Signed)
I spoke with the patient today.  I reassured her that this is not an adverse reaction to the propofol and would be perfectly safe to use that again in the future for any sedation needs.  No prior history of GERD, but certainly possible that she could have had some intra operative refluxate.  She is otherwise afebrile, pain lessening, and tolerating some liquids again.  No SOB, CP. She is planning to go out to the store to pick up some throat lozenges.  Could also trial short course of OTC Nexium.  All questions answered to the best of my ability and she was appreciative of the call back.

## 2021-12-08 NOTE — Telephone Encounter (Signed)
Inbound call from patient calling back requesting to speak with a provider in regards to symptoms she is experiencing. Patient had a procedure 12/07/21. Please give a call back to advise.

## 2021-12-14 ENCOUNTER — Encounter: Payer: Self-pay | Admitting: Gastroenterology

## 2022-01-09 DIAGNOSIS — H2513 Age-related nuclear cataract, bilateral: Secondary | ICD-10-CM | POA: Diagnosis not present

## 2022-01-09 DIAGNOSIS — H43812 Vitreous degeneration, left eye: Secondary | ICD-10-CM | POA: Diagnosis not present

## 2022-01-09 DIAGNOSIS — H43391 Other vitreous opacities, right eye: Secondary | ICD-10-CM | POA: Diagnosis not present

## 2022-02-20 DIAGNOSIS — N1832 Chronic kidney disease, stage 3b: Secondary | ICD-10-CM | POA: Diagnosis not present

## 2022-02-22 DIAGNOSIS — I129 Hypertensive chronic kidney disease with stage 1 through stage 4 chronic kidney disease, or unspecified chronic kidney disease: Secondary | ICD-10-CM | POA: Diagnosis not present

## 2022-02-22 DIAGNOSIS — E559 Vitamin D deficiency, unspecified: Secondary | ICD-10-CM | POA: Diagnosis not present

## 2022-02-22 DIAGNOSIS — N1832 Chronic kidney disease, stage 3b: Secondary | ICD-10-CM | POA: Diagnosis not present

## 2022-02-22 DIAGNOSIS — R7303 Prediabetes: Secondary | ICD-10-CM | POA: Diagnosis not present

## 2022-03-14 ENCOUNTER — Other Ambulatory Visit: Payer: Self-pay | Admitting: Internal Medicine

## 2022-03-19 ENCOUNTER — Telehealth: Payer: Self-pay | Admitting: Internal Medicine

## 2022-03-19 NOTE — Telephone Encounter (Signed)
Caller: (PATIENT ),  Relationship: (SELF )  Call Back Number: [phone]  Date of Last Office Visit: 04/07/21  Date of Next Office Visit: Not Scheduled (wants to come in January 2024 for CPE)  Medication(s) to be Refilled: AMITRIPTYLINE 25  Last Written:09/11/21   Preferred Pharmacy:  [facility] [address], [place] AT [place] [address] [place] [address] Phone: [phone]  Fax: [phone]

## 2022-03-19 NOTE — Telephone Encounter (Signed)
Rx sent - will need appt in jan for additional refills.

## 2022-03-22 NOTE — Progress Notes (Signed)
Subjective:   Danielle Fischer is a 71 y.o. female who presents for Medicare Annual (Subsequent) preventive examination. I connected with  Danielle Fischer on 03/23/22 by a audio enabled telemedicine application and verified that I am speaking with the correct person using two identifiers.  Patient Location: Home  Provider Location: Home Office  I discussed the limitations of evaluation and management by telemedicine. The patient expressed understanding and agreed to proceed.  Review of Systems    Deferred to PCP Cardiac Risk Factors include: advanced age (>17mn, >>85women);dyslipidemia     Objective:    There were no vitals filed for this visit. There is no height or weight on file to calculate BMI.     03/23/2022    9:22 AM 03/15/2021    9:26 AM  Advanced Directives  Does Patient Have a Medical Advance Directive? No No  Would patient like information on creating a medical advance directive? No - Patient declined No - Patient declined    Current Medications (verified) Outpatient Encounter Medications as of 03/23/2022  Medication Sig   amitriptyline (ELAVIL) 25 MG tablet TAKE 1 TABLET(25 MG) BY MOUTH AT BEDTIME   Calcium Carbonate-Vitamin D (CALCIUM + D PO) Take by mouth 2 (two) times daily.     metoprolol succinate (TOPROL-XL) 50 MG 24 hr tablet TAKE 1 TABLET BY MOUTH DAILY WITH OR IMMEDIATELY FOLLOWING A MEAL   valACYclovir (VALTREX) 1000 MG tablet Take 2 tablets (2,000 mg total) by mouth every 12 (twelve) hours as needed (for outbreaks). Take 2 pills x 1 as needed for outbreaks then 2 pills 12 hours later, MDD 4   Facility-Administered Encounter Medications as of 03/23/2022  Medication   0.9 %  sodium chloride infusion    Allergies (verified) Erythromycin   History: Past Medical History:  Diagnosis Date   Blood transfusion without reported diagnosis    Cataract    Chronic kidney disease    Cystocele    Heart murmur    Hypertension    Osteopenia    Rectocele     SUI (stress urinary incontinence, female)    Uterine prolapse    Past Surgical History:  Procedure Laterality Date   COLONOSCOPY  2009   negative; Dr [place]  A/P Repair   Family History  Problem Relation Age of Onset   Hypertension Mother    Osteoporosis Mother    Heart disease Father        MI @ 496  ALS Father    Hypertension Brother    Prostate cancer Brother    Breast cancer Paternal Aunt    Osteoporosis Maternal Grandmother    Stroke Paternal Grandmother    Diabetes Neg Hx    Colon cancer Neg Hx    Esophageal cancer Neg Hx    Rectal cancer Neg Hx    Stomach cancer Neg Hx    Social History   Socioeconomic History   Marital status: Married    Spouse name: Not on file   Number of children: Not on file   Years of education: Not on file   Highest education level: Not on file  Occupational History   Not on file  Tobacco Use   Smoking status: Former   Smokeless tobacco: Never   Tobacco comments:    smoked ages [age] up to 5 cigarettes/day  Substance and Sexual Activity   Alcohol use: Yes    Alcohol/week: 4.0 standard drinks of alcohol  Types: 4 Glasses of Veleta Yamamoto per week    Comment:  socially on weekends   Drug use: No   Sexual activity: Yes    Birth control/protection: Surgical  Other Topics Concern   Not on file  Social History Narrative   No regular exercise   Social Determinants of Health   Financial Resource Strain: Low Risk  (03/23/2022)   Overall Financial Resource Strain (CARDIA)    Difficulty of Paying Living Expenses: Not hard at all  Food Insecurity: No Food Insecurity (03/23/2022)   Hunger Vital Sign    Worried About Running Out of Food in the Last Year: Never true    Ran Out of Food in the Last Year: Never true  Transportation Needs: No Transportation Needs (03/23/2022)   PRAPARE - Hydrologist (Medical): No    Lack of Transportation (Non-Medical): No  Physical Activity:  Sufficiently Active (03/23/2022)   Exercise Vital Sign    Days of Exercise per Week: 5 days    Minutes of Exercise per Session: 40 min  Stress: No Stress Concern Present (03/23/2022)   [place]    Feeling of Stress : Not at all  Social Connections: [place] (03/23/2022)   Social Connection and Isolation Panel [NHANES]    Frequency of Communication with Friends and Family: More than three times a week    Frequency of Social Gatherings with Friends and Family: More than three times a week    Attends Religious Services: More than 4 times per year    Active Member of [employer] or Organizations: Yes    Attends Music therapist: More than 4 times per year    Marital Status: Married    Tobacco Counseling Counseling given: Not Answered Tobacco comments: smoked ages [age], up to 5 cigarettes/day   Clinical Intake:  Pre-visit preparation completed: Yes  Pain : No/denies pain     Nutritional Status: BMI of 19-24  Normal Nutritional Risks: None  How often do you need to have someone help you when you read instructions, pamphlets, or other written materials from your doctor or pharmacy?: 1 - Never  Diabetic?No  Interpreter Needed?: No  Information entered by :: Emelia Loron RN   Activities of Daily Living    03/23/2022    9:21 AM  In your present state of health, do you have any difficulty performing the following activities:  Hearing? 1  Comment reports she has a provider and will follow-up as needed  Vision? 0  Difficulty concentrating or making decisions? 0  Walking or climbing stairs? 0  Dressing or bathing? 0  Doing errands, shopping? 0  Preparing Food and eating ? N  Using the Toilet? N  In the past six months, have you accidently leaked urine? N  Do you have problems with loss of bowel control? N  Managing your Medications? N  Managing your Finances? N  Housekeeping or managing your  Housekeeping? N    Patient Care Team: Binnie Rail, MD as PCP - General (Internal Medicine) Warden Fillers, MD as Consulting Physician (Ophthalmology)  Indicate any recent Medical Services you may have received from other than [facility] providers in the past year (date may be approximate).     Assessment:   This is a routine wellness examination for Danielle Fischer.  Hearing/Vision screen No results found.  Dietary issues and exercise activities discussed: Current Exercise Habits: Home exercise routine, Type of exercise: walking, Time (Minutes): 40,  Frequency (Times/Week): 5, Weekly Exercise (Minutes/Week): 200, Intensity: Mild, Exercise limited by: None identified   Goals Addressed             This Visit's Progress    Patient Stated       I would like like to lose 5-8 pounds by eating small low calorie meals frequently and by continuing to exercise.       Depression Screen    03/23/2022    9:36 AM 03/15/2021    9:32 AM 02/24/2020    1:11 PM 07/22/2017   10:37 AM 04/12/2016    2:38 PM  PHQ 2/9 Scores  PHQ - 2 Score 0 0 0 0 0    Fall Risk    03/23/2022    9:23 AM 03/15/2021    9:28 AM 02/24/2020    1:07 PM 04/12/2016    2:38 PM  Fall Risk   Falls in the past year? 1 0 0 No  Number falls in past yr: 0 0 0   Injury with Fall? 0 1 0   Risk for fall due to : No Fall Risks No Fall Risks No Fall Risks   Follow up Falls evaluation completed Falls evaluation completed Falls evaluation completed     [place]:  Any stairs in or around the home? Yes  If so, are there any without handrails? Yes  Home free of loose throw rugs in walkways, pet beds, electrical cords, etc? Yes  Adequate lighting in your home to reduce risk of falls? Yes   ASSISTIVE DEVICES UTILIZED TO PREVENT FALLS:  Life alert? No  Use of a cane, walker or w/c? No  Grab bars in the bathroom? No  Shower chair or bench in shower? No  Elevated toilet seat or a handicapped toilet?  No   Cognitive Function:        03/23/2022    9:24 AM  6CIT Screen  What Year? 0 points  What month? 0 points  What time? 0 points  Count back from 20 0 points  Months in reverse 0 points  Repeat phrase 0 points  Total Score 0 points    Immunizations Immunization History  Administered Date(s) Administered   Fluad Quad(high Dose 65+) 02/24/2020   Influenza, High Dose Seasonal PF 02/21/2018, 02/26/2021   Influenza-Unspecified 01/23/2015, 02/20/2017   PFIZER(Purple Top)SARS-COV-2 Vaccination 05/30/2019, 06/20/2019, 03/13/2020   Pneumococcal Conjugate-13 05/15/2016   Pneumococcal Polysaccharide-23 07/22/2017   Td 04/23/2002   Zoster Recombinat (Shingrix) 02/26/2021, 05/05/2021    TDAP status: Due, Education has been provided regarding the importance of this vaccine. Advised may receive this vaccine at local pharmacy or Health Dept. Aware to provide a copy of the vaccination record if obtained from local pharmacy or Health Dept. Verbalized acceptance and understanding.  Flu Vaccine status: Due, Education has been provided regarding the importance of this vaccine. Advised may receive this vaccine at local pharmacy or Health Dept. Aware to provide a copy of the vaccination record if obtained from local pharmacy or Health Dept. Verbalized acceptance and understanding.  Pneumococcal vaccine status: Up to date  Covid-19 vaccine status: Information provided on how to obtain vaccines.   Qualifies for Shingles Vaccine? Yes   Zostavax completed No   Shingrix Completed?: Yes  Screening Tests Health Maintenance  Topic Date Due   DTaP/Tdap/Td (2 - Tdap) 04/23/2012   COVID-19 Vaccine (4 - 2023-24 season) 12/22/2021   INFLUENZA VACCINE  07/22/2022 (Originally 11/21/2021)   Medicare Annual Wellness (AWV)  03/24/2023   MAMMOGRAM  08/30/2023   COLONOSCOPY (Pts 45-30yr Insurance coverage will need to be confirmed)  12/07/2024   Pneumonia Vaccine 71 Years old  Completed   DEXA SCAN   Completed   Hepatitis C Screening  Completed   Zoster Vaccines- Shingrix  Completed   HPV VACCINES  Aged Out    Health Maintenance  Health Maintenance Due  Topic Date Due   DTaP/Tdap/Td (2 - Tdap) 04/23/2012   COVID-19 Vaccine (4 - 2023-24 season) 12/22/2021    Colorectal cancer screening: Type of screening: Colonoscopy. Completed 12/07/21. Repeat every 3 years  Mammogram status: Completed 08/29/21. Repeat every year  Bone Density status: Completed 03/31/21. Results reflect: Bone density results: OSTEOPENIA. Repeat every 2 years.  Lung Cancer Screening: (Low Dose CT Chest recommended if Age 71-80years, 30 pack-year currently smoking OR have quit w/in 15years.) does not qualify.   Additional Screening:  Hepatitis C Screening: does qualify; Completed 05/15/16  Vision Screening: Recommended annual ophthalmology exams for early detection of glaucoma and other disorders of the eye. Is the patient up to date with their annual eye exam?  Yes  Who is the provider or what is the name of the office in which the patient attends annual eye exams? [facility] If pt is not established with a provider, would they like to be referred to a provider to establish care?  N/A .   Dental Screening: Recommended annual dental exams for proper oral hygiene  Community Resource Referral / Chronic Care Management: CRR required this visit?  No   CCM required this visit?  No      Plan:     I have personally reviewed and noted the following in the patient's chart:   Medical and social history Use of alcohol, tobacco or illicit drugs  Current medications and supplements including opioid prescriptions. Patient is not currently taking opioid prescriptions. Functional ability and status Nutritional status Physical activity Advanced directives List of other physicians Hospitalizations, surgeries, and ER visits in previous 12 months Vitals Screenings to include cognitive, depression, and  falls Referrals and appointments  In addition, I have reviewed and discussed with patient certain preventive protocols, quality metrics, and best practice recommendations. A written personalized care plan for preventive services as well as general preventive health recommendations were provided to patient.     JMichiel Cowboy RN   03/23/2022   Nurse Notes:  Ms. HBergman, Thank you for taking time to come for your Medicare Wellness Visit. I appreciate your ongoing commitment to your health goals. Please review the following plan we discussed and let me know if I can assist you in the future.   These are the goals we discussed:  Goals       Patient Stated     Patient Stated (pt-stated)      Patient declined health goal at this time.      Other     Patient Stated      I would like like to lose 5-8 pounds by eating small low calorie meals frequently and by continuing to exercise.         This is a list of the screening recommended for you and due dates:  Health Maintenance  Topic Date Due   DTaP/Tdap/Td vaccine (2 - Tdap) 04/23/2012   COVID-19 Vaccine (4 - 2023-24 season) 12/22/2021   Flu Shot  07/22/2022*   Medicare Annual Wellness Visit  03/24/2023   Mammogram  08/30/2023   Colon Cancer Screening  12/07/2024   Pneumonia Vaccine  Completed   DEXA scan (bone density measurement)  Completed   Hepatitis C Screening: USPSTF Recommendation to screen - Ages 72-79 yo.  Completed   Zoster (Shingles) Vaccine  Completed   HPV Vaccine  Aged Out  *Topic was postponed. The date shown is not the original due date.

## 2022-03-22 NOTE — Patient Instructions (Signed)

## 2022-03-23 ENCOUNTER — Ambulatory Visit (INDEPENDENT_AMBULATORY_CARE_PROVIDER_SITE_OTHER): Payer: PPO | Admitting: *Deleted

## 2022-03-23 DIAGNOSIS — Z0000 Encounter for general adult medical examination without abnormal findings: Secondary | ICD-10-CM | POA: Diagnosis not present

## 2022-05-15 ENCOUNTER — Telehealth: Payer: Self-pay | Admitting: Internal Medicine

## 2022-05-15 DIAGNOSIS — R7303 Prediabetes: Secondary | ICD-10-CM

## 2022-05-15 DIAGNOSIS — M8589 Other specified disorders of bone density and structure, multiple sites: Secondary | ICD-10-CM

## 2022-05-15 DIAGNOSIS — N1832 Chronic kidney disease, stage 3b: Secondary | ICD-10-CM

## 2022-05-15 DIAGNOSIS — E559 Vitamin D deficiency, unspecified: Secondary | ICD-10-CM

## 2022-05-15 DIAGNOSIS — I10 Essential (primary) hypertension: Secondary | ICD-10-CM

## 2022-05-15 DIAGNOSIS — E782 Mixed hyperlipidemia: Secondary | ICD-10-CM

## 2022-05-15 NOTE — Telephone Encounter (Signed)
Blood work ordered.

## 2022-05-15 NOTE — Telephone Encounter (Signed)
Patient has an appointment for a physical on Thursda at 9:10.  She would like to know if her labs can be ordered for today so the results would be back before her appointment.  Please advise.  Patient's Number [phone]

## 2022-05-15 NOTE — Telephone Encounter (Signed)
Message left for patient.  Labs have been placed and she just needs lab appointment.  Please schedule if she calls back.

## 2022-05-16 ENCOUNTER — Encounter: Payer: Self-pay | Admitting: Internal Medicine

## 2022-05-16 NOTE — Progress Notes (Unsigned)
Subjective:    Patient ID: Danielle Fischer, female    DOB: 06-30-1950, 72 y.o.   MRN: [id number]      HPI Danielle Fischer is here for a Physical exam.    Need sugars well controlled to prevent worsening ckd Add arb if bp high  Start q 6 month  Medications and allergies reviewed with patient and updated if appropriate.  Current Outpatient Medications on File Prior to Visit  Medication Sig Dispense Refill   amitriptyline (ELAVIL) 25 MG tablet TAKE 1 TABLET(25 MG) BY MOUTH AT BEDTIME 90 tablet 0   Calcium Carbonate-Vitamin D (CALCIUM + D PO) Take by mouth 2 (two) times daily.       metoprolol succinate (TOPROL-XL) 50 MG 24 hr tablet TAKE 1 TABLET BY MOUTH DAILY WITH OR IMMEDIATELY FOLLOWING A MEAL 90 tablet 3   valACYclovir (VALTREX) 1000 MG tablet Take 2 tablets (2,000 mg total) by mouth every 12 (twelve) hours as needed (for outbreaks). Take 2 pills x 1 as needed for outbreaks then 2 pills 12 hours later, MDD 4 40 tablet 5   Current Facility-Administered Medications on File Prior to Visit  Medication Dose Route Frequency Provider Last Rate Last Admin   0.9 %  sodium chloride infusion  500 mL Intravenous Once Cirigliano, Vito V, DO        Review of Systems     Objective:  There were no vitals filed for this visit. There were no vitals filed for this visit. There is no height or weight on file to calculate BMI.  BP Readings from Last 3 Encounters:  12/07/21 (!) 116/51  04/07/21 124/80  03/22/21 118/70    Wt Readings from Last 3 Encounters:  12/07/21 145 lb (65.8 kg)  11/20/21 145 lb (65.8 kg)  04/07/21 143 lb (64.9 kg)       Physical Exam Constitutional: She appears well-developed and well-nourished. No distress.  HENT:  Head: Normocephalic and atraumatic.  Right Ear: External ear normal. Normal ear canal and TM Left Ear: External ear normal.  Normal ear canal and TM Mouth/Throat: Oropharynx is clear and moist.  Eyes: Conjunctivae normal.  Neck: Neck supple. No  tracheal deviation present. No thyromegaly present.  No carotid bruit  Cardiovascular: Normal rate, regular rhythm and normal heart sounds.   No murmur heard.  No edema. Pulmonary/Chest: Effort normal and breath sounds normal. No respiratory distress. She has no wheezes. She has no rales.  Breast: deferred   Abdominal: Soft. She exhibits no distension. There is no tenderness.  Lymphadenopathy: She has no cervical adenopathy.  Skin: Skin is warm and dry. She is not diaphoretic.  Psychiatric: She has a normal mood and affect. Her behavior is normal.     Lab Results  Component Value Date   WBC 7.9 03/31/2021   HGB 13.2 03/31/2021   HCT 40.0 03/31/2021   PLT 274.0 03/31/2021   GLUCOSE 105 (H) 03/31/2021   CHOL 209 (H) 03/31/2021   TRIG 138.0 03/31/2021   HDL 79.90 03/31/2021   LDLDIRECT 115.4 04/25/2011   LDLCALC 101 (H) 03/31/2021   ALT 13 03/31/2021   AST 15 03/31/2021   NA 137 03/31/2021   K 4.7 03/31/2021   CL 101 03/31/2021   CREATININE 1.45 (H) 03/31/2021   BUN 23 03/31/2021   CO2 28 03/31/2021   TSH 1.52 03/31/2021   HGBA1C 5.9 03/31/2021         Assessment & Plan:   Physical exam: Screening blood work  ordered Exercise  Weight   Substance abuse  none   Reviewed recommended immunizations.   Health Maintenance  Topic Date Due   DTaP/Tdap/Td (2 - Tdap) 04/23/2012   COVID-19 Vaccine (4 - 2023-24 season) 12/22/2021   Medicare Annual Wellness (AWV)  03/24/2023   MAMMOGRAM  08/30/2023   COLONOSCOPY (Pts 45-36yr Insurance coverage will need to be confirmed)  12/07/2024   Pneumonia Vaccine 72 Years old  Completed   INFLUENZA VACCINE  Completed   DEXA SCAN  Completed   Hepatitis C Screening  Completed   Zoster Vaccines- Shingrix  Completed   HPV VACCINES  Aged Out          See Problem List for Assessment and Plan of chronic medical problems.

## 2022-05-16 NOTE — Patient Instructions (Addendum)
Blood work was ordered.   The lab is on the first floor.    Medications changes include :   none     Return in about 1 year (around 05/18/2023) for Physical Exam.    Health Maintenance, Female Adopting a healthy lifestyle and getting preventive care are important in promoting health and wellness. Ask your health care provider about: The right schedule for you to have regular tests and exams. Things you can do on your own to prevent diseases and keep yourself healthy. What should I know about diet, weight, and exercise? Eat a healthy diet  Eat a diet that includes plenty of vegetables, fruits, low-fat dairy products, and lean protein. Do not eat a lot of foods that are high in solid fats, added sugars, or sodium. Maintain a healthy weight Body mass index (BMI) is used to identify weight problems. It estimates body fat based on height and weight. Your health care provider can help determine your BMI and help you achieve or maintain a healthy weight. Get regular exercise Get regular exercise. This is one of the most important things you can do for your health. Most adults should: Exercise for at least 150 minutes each week. The exercise should increase your heart rate and make you sweat (moderate-intensity exercise). Do strengthening exercises at least twice a week. This is in addition to the moderate-intensity exercise. Spend less time sitting. Even light physical activity can be beneficial. Watch cholesterol and blood lipids Have your blood tested for lipids and cholesterol at 72 years of age, then have this test every 5 years. Have your cholesterol levels checked more often if: Your lipid or cholesterol levels are high. You are older than 72 years of age. You are at high risk for heart disease. What should I know about cancer screening? Depending on your health history and family history, you may need to have cancer screening at various ages. This may include screening  for: Breast cancer. Cervical cancer. Colorectal cancer. Skin cancer. Lung cancer. What should I know about heart disease, diabetes, and high blood pressure? Blood pressure and heart disease High blood pressure causes heart disease and increases the risk of stroke. This is more likely to develop in people who have high blood pressure readings or are overweight. Have your blood pressure checked: Every 3-5 years if you are 26-14 years of age. Every year if you are 51 years old or older. Diabetes Have regular diabetes screenings. This checks your fasting blood sugar level. Have the screening done: Once every three years after age 19 if you are at a normal weight and have a low risk for diabetes. More often and at a younger age if you are overweight or have a high risk for diabetes. What should I know about preventing infection? Hepatitis B If you have a higher risk for hepatitis B, you should be screened for this virus. Talk with your health care provider to find out if you are at risk for hepatitis B infection. Hepatitis C Testing is recommended for: Everyone born from [birth year] through 1965. Anyone with known risk factors for hepatitis C. Sexually transmitted infections (STIs) Get screened for STIs, including gonorrhea and chlamydia, if: You are sexually active and are younger than 72 years of age. You are older than 72 years of age and your health care provider tells you that you are at risk for this type of infection. Your sexual activity has changed since you were last screened, and you are  at increased risk for chlamydia or gonorrhea. Ask your health care provider if you are at risk. Ask your health care provider about whether you are at high risk for HIV. Your health care provider may recommend a prescription medicine to help prevent HIV infection. If you choose to take medicine to prevent HIV, you should first get tested for HIV. You should then be tested every 3 months for as long as you  are taking the medicine. Pregnancy If you are about to stop having your period (premenopausal) and you may become pregnant, seek counseling before you get pregnant. Take 400 to 800 micrograms (mcg) of folic acid every day if you become pregnant. Ask for birth control (contraception) if you want to prevent pregnancy. Osteoporosis and menopause Osteoporosis is a disease in which the bones lose minerals and strength with aging. This can result in bone fractures. If you are 71 years old or older, or if you are at risk for osteoporosis and fractures, ask your health care provider if you should: Be screened for bone loss. Take a calcium or vitamin D supplement to lower your risk of fractures. Be given hormone replacement therapy (HRT) to treat symptoms of menopause. Follow these instructions at home: Alcohol use Do not drink alcohol if: Your health care provider tells you not to drink. You are pregnant, may be pregnant, or are planning to become pregnant. If you drink alcohol: Limit how much you have to: 0-1 drink a day. Know how much alcohol is in your drink. In the [place] one drink equals one 12 oz bottle of beer (355 mL), one 5 oz glass of wine (148 mL), or one 1 oz glass of hard liquor (44 mL). Lifestyle Do not use any products that contain nicotine or tobacco. These products include cigarettes, chewing tobacco, and vaping devices, such as e-cigarettes. If you need help quitting, ask your health care provider. Do not use street drugs. Do not share needles. Ask your health care provider for help if you need support or information about quitting drugs. General instructions Schedule regular health, dental, and eye exams. Stay current with your vaccines. Tell your health care provider if: You often feel depressed. You have ever been abused or do not feel safe at home. Summary Adopting a healthy lifestyle and getting preventive care are important in promoting health and wellness. Follow your  health care provider's instructions about healthy diet, exercising, and getting tested or screened for diseases. Follow your health care provider's instructions on monitoring your cholesterol and blood pressure. This information is not intended to replace advice given to you by your health care provider. Make sure you discuss any questions you have with your health care provider. Document Revised: 08/29/2020 Document Reviewed: 08/29/2020 Elsevier Patient Education  [place].

## 2022-05-17 ENCOUNTER — Ambulatory Visit (INDEPENDENT_AMBULATORY_CARE_PROVIDER_SITE_OTHER): Payer: PPO | Admitting: Internal Medicine

## 2022-05-17 VITALS — BP 120/80 | HR 70 | Temp 98.1°F | Ht 64.0 in

## 2022-05-17 DIAGNOSIS — Z0000 Encounter for general adult medical examination without abnormal findings: Secondary | ICD-10-CM

## 2022-05-17 DIAGNOSIS — B009 Herpesviral infection, unspecified: Secondary | ICD-10-CM | POA: Diagnosis not present

## 2022-05-17 DIAGNOSIS — E782 Mixed hyperlipidemia: Secondary | ICD-10-CM | POA: Diagnosis not present

## 2022-05-17 DIAGNOSIS — R1310 Dysphagia, unspecified: Secondary | ICD-10-CM | POA: Diagnosis not present

## 2022-05-17 DIAGNOSIS — N1832 Chronic kidney disease, stage 3b: Secondary | ICD-10-CM | POA: Diagnosis not present

## 2022-05-17 DIAGNOSIS — M542 Cervicalgia: Secondary | ICD-10-CM

## 2022-05-17 DIAGNOSIS — R7303 Prediabetes: Secondary | ICD-10-CM

## 2022-05-17 DIAGNOSIS — I10 Essential (primary) hypertension: Secondary | ICD-10-CM

## 2022-05-17 DIAGNOSIS — E559 Vitamin D deficiency, unspecified: Secondary | ICD-10-CM | POA: Diagnosis not present

## 2022-05-17 DIAGNOSIS — G8929 Other chronic pain: Secondary | ICD-10-CM | POA: Diagnosis not present

## 2022-05-17 NOTE — Assessment & Plan Note (Signed)
New Started 2-3 months ago - started having difficulty swallowing pills  No issues swallowing fluids, food She can swallow them with yogurt She now wonders if she is trying too hard to swallow that that is causing the issue She will monitor and let me know if it persists - if so we can refer to ENT

## 2022-05-17 NOTE — Assessment & Plan Note (Signed)
Chronic Taking vitamin d daily Check vitamin d level  

## 2022-05-17 NOTE — Assessment & Plan Note (Signed)
Chronic Controlled Continue amitriptyline nightly

## 2022-05-17 NOTE — Assessment & Plan Note (Signed)
Chronic BP well controlled Continue metoprolol xl 50 mg daily Cmp, cbc

## 2022-05-17 NOTE — Assessment & Plan Note (Signed)
Chronic Following with [facility] - Dr Royce Macadamia Renal function stable Stressed keeping BP and sugars well controlled to prevent further kidney damage Does not take any NSAIDs Encouraged good water intake CBC, CMP

## 2022-05-17 NOTE — Assessment & Plan Note (Signed)
Chronic Intermittent Continue Valtrex as needed

## 2022-05-17 NOTE — Assessment & Plan Note (Signed)
Chronic Regular exercise and healthy diet encouraged Check lipid panel  Continue lifestyle control 

## 2022-05-17 NOTE — Assessment & Plan Note (Signed)
Chronic Check a1c Low sugar / carb diet Stressed regular exercise Discussed importance of keeping sugars controlled to prevent worsening of kidney function

## 2022-06-11 ENCOUNTER — Telehealth: Payer: Self-pay

## 2022-06-11 MED ORDER — METOPROLOL SUCCINATE ER 50 MG PO TB24: ORAL_TABLET | ORAL | 1 refills | Status: DC

## 2022-06-11 MED ORDER — AMITRIPTYLINE HCL 25 MG PO TABS: ORAL_TABLET | ORAL | 1 refills | Status: DC

## 2022-06-11 NOTE — Telephone Encounter (Signed)
Pt has called asking for a rx refill for her   amitriptyline (ELAVIL)  metoprolol succinate (TOPROL-XL) 50 MG 24 hr tablet

## 2022-06-11 NOTE — Telephone Encounter (Signed)
Refill has been sent to [facility]...Johny Chess

## 2022-06-20 ENCOUNTER — Telehealth: Payer: Self-pay | Admitting: Internal Medicine

## 2022-06-20 DIAGNOSIS — R198 Other specified symptoms and signs involving the digestive system and abdomen: Secondary | ICD-10-CM

## 2022-06-20 DIAGNOSIS — J029 Acute pharyngitis, unspecified: Secondary | ICD-10-CM

## 2022-06-20 NOTE — Telephone Encounter (Signed)
Referral ordered

## 2022-06-20 NOTE — Telephone Encounter (Signed)
Patient states that she is still having problems with the place in her throat hurting that she has discussed with Dr. Quay Burow previously.  Patient wants to know if you should send her to an ENT oir send her for an endoscopy.  Please call patient to discuss this.  [phone]

## 2022-07-12 DIAGNOSIS — D1801 Hemangioma of skin and subcutaneous tissue: Secondary | ICD-10-CM | POA: Diagnosis not present

## 2022-07-12 DIAGNOSIS — L812 Freckles: Secondary | ICD-10-CM | POA: Diagnosis not present

## 2022-07-12 DIAGNOSIS — L570 Actinic keratosis: Secondary | ICD-10-CM | POA: Diagnosis not present

## 2022-07-12 DIAGNOSIS — L821 Other seborrheic keratosis: Secondary | ICD-10-CM | POA: Diagnosis not present

## 2022-07-30 ENCOUNTER — Telehealth: Payer: Self-pay | Admitting: Internal Medicine

## 2022-07-30 NOTE — Telephone Encounter (Signed)
If she is still having a problem she needs to see either GI or ENT for further evaluation.

## 2022-07-30 NOTE — Telephone Encounter (Signed)
Patient called and said she talked to PCP during her physical in January about an issue with her throat. She asked for a referral for an ENT, but the problem seemed to go away and she canceled. She's wondering if Dr. Lawerance Bach would recommend an endoscopy to find out what the issue is. Best callback is [phone].

## 2022-07-31 NOTE — Telephone Encounter (Signed)
Spoke with patient today.  She wants to go to ENT and will call to self schedule since referral is still open.

## 2022-08-23 DIAGNOSIS — J029 Acute pharyngitis, unspecified: Secondary | ICD-10-CM | POA: Diagnosis not present

## 2022-08-23 DIAGNOSIS — R1310 Dysphagia, unspecified: Secondary | ICD-10-CM | POA: Diagnosis not present

## 2022-08-23 DIAGNOSIS — N1832 Chronic kidney disease, stage 3b: Secondary | ICD-10-CM | POA: Diagnosis not present

## 2022-08-29 ENCOUNTER — Other Ambulatory Visit: Payer: Self-pay | Admitting: Physician Assistant

## 2022-08-29 DIAGNOSIS — R1310 Dysphagia, unspecified: Secondary | ICD-10-CM

## 2022-08-30 ENCOUNTER — Telehealth: Payer: Self-pay | Admitting: Internal Medicine

## 2022-08-30 ENCOUNTER — Other Ambulatory Visit (INDEPENDENT_AMBULATORY_CARE_PROVIDER_SITE_OTHER): Payer: PPO

## 2022-08-30 DIAGNOSIS — E559 Vitamin D deficiency, unspecified: Secondary | ICD-10-CM

## 2022-08-30 DIAGNOSIS — E782 Mixed hyperlipidemia: Secondary | ICD-10-CM

## 2022-08-30 DIAGNOSIS — I10 Essential (primary) hypertension: Secondary | ICD-10-CM | POA: Diagnosis not present

## 2022-08-30 DIAGNOSIS — R7303 Prediabetes: Secondary | ICD-10-CM

## 2022-08-30 DIAGNOSIS — N1832 Chronic kidney disease, stage 3b: Secondary | ICD-10-CM

## 2022-08-30 DIAGNOSIS — I129 Hypertensive chronic kidney disease with stage 1 through stage 4 chronic kidney disease, or unspecified chronic kidney disease: Secondary | ICD-10-CM | POA: Diagnosis not present

## 2022-08-30 LAB — LIPID PANEL
Cholesterol: 210 mg/dL — ABNORMAL HIGH (ref 0–200)
HDL: 68.3 mg/dL (ref >39.00)
LDL Cholesterol: 107 mg/dL — ABNORMAL HIGH (ref 0–99)
NonHDL: 141.71
Total CHOL/HDL Ratio: 3
Triglycerides: 175 mg/dL — ABNORMAL HIGH (ref 0.0–149.0)
VLDL: 35 mg/dL (ref 0.0–40.0)

## 2022-08-30 LAB — CBC WITH DIFFERENTIAL/PLATELET
Basophils Absolute: 0.1 10*3/uL (ref 0.0–0.1)
Basophils Relative: 1.1 % (ref 0.0–3.0)
Eosinophils Absolute: 0.3 10*3/uL (ref 0.0–0.7)
Eosinophils Relative: 5.4 % — ABNORMAL HIGH (ref 0.0–5.0)
HCT: 37.5 % (ref 36.0–46.0)
Hemoglobin: 12.7 g/dL (ref 12.0–15.0)
Lymphocytes Relative: 35.8 % (ref 12.0–46.0)
Lymphs Abs: 1.8 10*3/uL (ref 0.7–4.0)
MCHC: 33.9 g/dL (ref 30.0–36.0)
MCV: 95.8 fl (ref 78.0–100.0)
Monocytes Absolute: 0.5 10*3/uL (ref 0.1–1.0)
Monocytes Relative: 9.4 % (ref 3.0–12.0)
Neutro Abs: 2.4 10*3/uL (ref 1.4–7.7)
Neutrophils Relative %: 48.3 % (ref 43.0–77.0)
Platelets: 238 10*3/uL (ref 150.0–400.0)
RBC: 3.92 Mil/uL (ref 3.87–5.11)
RDW: 13 % (ref 11.5–15.5)
WBC: 5 10*3/uL (ref 4.0–10.5)

## 2022-08-30 LAB — COMPREHENSIVE METABOLIC PANEL
ALT: 11 U/L (ref 0–35)
AST: 18 U/L (ref 0–37)
Albumin: 4 g/dL (ref 3.5–5.2)
Alkaline Phosphatase: 68 U/L (ref 39–117)
BUN: 24 mg/dL — ABNORMAL HIGH (ref 6–23)
CO2: 29 mEq/L (ref 19–32)
Calcium: 9.5 mg/dL (ref 8.4–10.5)
Chloride: 105 mEq/L (ref 96–112)
Creatinine, Ser: 1.75 mg/dL — ABNORMAL HIGH (ref 0.40–1.20)
GFR: 28.97 mL/min — ABNORMAL LOW (ref >60.00)
Glucose, Bld: 76 mg/dL (ref 70–99)
Potassium: 4.6 mEq/L (ref 3.5–5.1)
Sodium: 141 mEq/L (ref 135–145)
Total Bilirubin: 0.4 mg/dL (ref 0.2–1.2)
Total Protein: 6.8 g/dL (ref 6.0–8.3)

## 2022-08-30 LAB — TSH: TSH: 2.26 u[IU]/mL (ref 0.35–5.50)

## 2022-08-30 LAB — HEMOGLOBIN A1C: Hgb A1c MFr Bld: 5.5 % (ref 4.6–6.5)

## 2022-08-30 LAB — VITAMIN D 25 HYDROXY (VIT D DEFICIENCY, FRACTURES): VITD: 40.87 ng/mL (ref 30.00–100.00)

## 2022-08-30 NOTE — Telephone Encounter (Signed)
Pt call wanting Dr. Lawerance Bach to put some blood work orders in. Pt stated they talked about this during her OV back in January.

## 2022-08-30 NOTE — Telephone Encounter (Signed)
Lab appointment scheduled today

## 2022-08-31 ENCOUNTER — Encounter: Payer: Self-pay | Admitting: Internal Medicine

## 2022-08-31 DIAGNOSIS — Z1231 Encounter for screening mammogram for malignant neoplasm of breast: Secondary | ICD-10-CM | POA: Diagnosis not present

## 2022-08-31 LAB — HM MAMMOGRAPHY

## 2022-09-18 DIAGNOSIS — N1832 Chronic kidney disease, stage 3b: Secondary | ICD-10-CM | POA: Diagnosis not present

## 2022-10-01 DIAGNOSIS — R1310 Dysphagia, unspecified: Secondary | ICD-10-CM | POA: Diagnosis not present

## 2022-10-01 DIAGNOSIS — K219 Gastro-esophageal reflux disease without esophagitis: Secondary | ICD-10-CM | POA: Diagnosis not present

## 2022-10-01 DIAGNOSIS — K224 Dyskinesia of esophagus: Secondary | ICD-10-CM | POA: Diagnosis not present

## 2022-11-23 ENCOUNTER — Other Ambulatory Visit: Payer: Self-pay

## 2022-11-26 ENCOUNTER — Encounter: Payer: Self-pay | Admitting: Gastroenterology

## 2022-11-26 ENCOUNTER — Ambulatory Visit (INDEPENDENT_AMBULATORY_CARE_PROVIDER_SITE_OTHER): Payer: PPO | Admitting: Gastroenterology

## 2022-11-26 VITALS — BP 128/82 | Ht 64.0 in | Wt 146.1 lb

## 2022-11-26 DIAGNOSIS — R1310 Dysphagia, unspecified: Secondary | ICD-10-CM | POA: Diagnosis not present

## 2022-11-26 DIAGNOSIS — Z86010 Personal history of colonic polyps: Secondary | ICD-10-CM

## 2022-11-26 DIAGNOSIS — R933 Abnormal findings on diagnostic imaging of other parts of digestive tract: Secondary | ICD-10-CM

## 2022-11-26 NOTE — Progress Notes (Signed)
Chief Complaint:    Dysphagia   HPI:     Patient is a 72 y.o. female with a history of CKD, HTN, osteopenia, presenting to the Gastroenterology Clinic for evaluation of dysphagia.  Has been having intermittent difficulty swallowing pills for the last 8 months or so (symptoms started in 04/2022). Sxs only occur on the right side. Never while eating or drinking fluids.  Can occasionally have symptoms in the middle of the day.  No history of food impaction.  Denies any history of reflux sxs. No Heartburn, regurgitation.   She was seen in the [facility] ENT clinic for this issue on 08/23/2022.  Laryngoscopy that day was essentially normal.  Recommended barium swallow study and discussed reflux precautions.  - 10/01/2022: Double contrast esophagram: Full column spontaneous gastroesophageal reflux.  Moderate esophageal dysmotility with delayed emptying.  Widely patent GE junction and no esophageal stricture, mass.  13 mm barium tablet passed into the stomach without delay.  Endoscopic History: - 12/01/2007: Colonoscopy: Normal - 12/07/2021: Colonoscopy: 2 subcentimeter sessile serrated polyps in the ascending colon, 6 mm sigmoid sessile serrated polyp, sigmoid diverticulosis with tortuosity.  Normal TI.  Recommended repeat in 3 years      Latest Ref Rng & Units 08/30/2022   12:01 PM 03/31/2021   11:40 AM 02/15/2020   11:03 AM  CBC  WBC 4.0 - 10.5 K/uL 5.0  7.9  5.1   Hemoglobin 12.0 - 15.0 g/dL 16.1  09.6  04.5   Hematocrit 36.0 - 46.0 % 37.5  40.0  37.9   Platelets 150.0 - 400.0 K/uL 238.0  274.0  235.0       Latest Ref Rng & Units 08/30/2022   12:01 PM 03/31/2021   11:40 AM 02/15/2020   11:03 AM  CMP  Glucose 70 - 99 mg/dL 76  409  96   BUN 6 - 23 mg/dL 24  23  19    Creatinine 0.40 - 1.20 mg/dL 8.11  9.14  7.82   Sodium 135 - 145 mEq/L 141  137  138   Potassium 3.5 - 5.1 mEq/L 4.6  4.7  4.4   Chloride 96 - 112 mEq/L 105  101  105   CO2 19 - 32 mEq/L 29  28  26    Calcium 8.4 -  10.5 mg/dL 9.5  9.7  9.2   Total Protein 6.0 - 8.3 g/dL 6.8  7.1  6.5   Total Bilirubin 0.2 - 1.2 mg/dL 0.4  0.8  0.6   Alkaline Phos 39 - 117 U/L 68  69  73   AST 0 - 37 U/L 18  15  16    ALT 0 - 35 U/L 11  13  10        Review of systems:     No chest pain, no SOB, no fevers, no urinary sx   Past Medical History:  Diagnosis Date   Blood transfusion without reported diagnosis    Cataract    Chronic kidney disease    Cystocele    Heart murmur    Hypertension    Osteopenia    Rectocele    SUI (stress urinary incontinence, female)    Uterine prolapse     Patient's surgical history, family medical history, social history, medications and allergies were all reviewed in Epic    Current Outpatient Medications  Medication Sig Dispense Refill   amitriptyline (ELAVIL) 25 MG tablet TAKE 1 TABLET(25 MG) BY MOUTH AT BEDTIME 90 tablet 1   Calcium  Carbonate-Vitamin D (CALCIUM + D PO) Take by mouth 2 (two) times daily.       metoprolol succinate (TOPROL-XL) 50 MG 24 hr tablet Take with or immediately following a meal. 90 tablet 1   metoprolol tartrate (LOPRESSOR) 50 MG tablet Take 50 mg by mouth daily.     valACYclovir (VALTREX) 1000 MG tablet Take 2 tablets (2,000 mg total) by mouth every 12 (twelve) hours as needed (for outbreaks). Take 2 pills x 1 as needed for outbreaks then 2 pills 12 hours later, MDD 4 40 tablet 5   No current facility-administered medications for this visit.    Physical Exam:     BP 128/82   Ht 5\' 4"  (1.626 m)   Wt 146 lb 2 oz (66.3 kg)   BMI 25.08 kg/m   GENERAL:  Pleasant female in NAD PSYCH: : Cooperative, normal affect Musculoskeletal:  Normal muscle tone, normal strength NEURO: Alert and oriented x 3, no focal neurologic deficits   IMPRESSION and PLAN:    1) Dysphagia 2) Reflux noted on esophagram 3) Abnormal imaging study  72 year old female with 47-month history of intermittent symptoms, perceived as pill dysphagia.  Discussed full DDx, to  include possibility of globus sensation, atypical reflux, etc. with plan as follows:  - Expedited EGD this week for diagnostic and potentially therapeutic intent with esophageal dilation and/or biopsies as appropriate. - Will evaluate for erosive esophagitis, LES laxity, hiatal hernia, gastric inlet patch, etc. time of EGD - Depending on endoscopic findings, discussed trialing course of high-dose PPI for diagnostic and therapeutic intent - If EGD unrevealing and no response to trial of high-dose PPI, plan for Esophageal Manometry for further characterization of the dysmotility noted on esophagram -In the meantime, cut food into small pieces, eat small bites, chew food thoroughly and with plenty of liquids to avoid food impaction.   4) History of colon polyps - Repeat colonoscopy in 11/2024 for ongoing polyp surveillance  The indications, risks, and benefits of EGD were explained to the patient in detail. Risks include but are not limited to bleeding, perforation, adverse reaction to medications, and cardiopulmonary compromise. Sequelae include but are not limited to the possibility of surgery, hospitalization, and mortality. The patient verbalized understanding and wished to proceed. All questions answered, referred to scheduler. Further recommendations pending results of the exam.            Shellia Cleverly ,DO, FACG 11/26/2022, 8:28 AM

## 2022-11-26 NOTE — Patient Instructions (Addendum)
You have been scheduled for an endoscopy. Please follow written instructions given to you at your visit today.  If you use inhalers (even only as needed), please bring them with you on the day of your procedure.  If you take any of the following medications, they will need to be adjusted prior to your procedure:   DO NOT TAKE 7 DAYS PRIOR TO TEST- Trulicity (dulaglutide) Ozempic, Wegovy (semaglutide) Mounjaro (tirzepatide) Bydureon Bcise (exanatide extended release)  DO NOT TAKE 1 DAY PRIOR TO YOUR TEST Rybelsus (semaglutide) Adlyxin (lixisenatide) Victoza (liraglutide) Byetta (exanatide) ___________________________________________________________________________     _______________________________________________________  If your blood pressure at your visit was 140/90 or greater, please contact your primary care physician to follow up on this.  _______________________________________________________  If you are age 72 or older, your body mass index should be between 23-30. Your Body mass index is 25.08 kg/m. If this is out of the aforementioned range listed, please consider follow up with your Primary Care Provider.  __________________________________________________________  The [place] [facility] providers would like to encourage you to use [facility] to communicate with providers for non-urgent requests or questions.  Due to long hold times on the telephone, sending your provider a message by [facility] may be a faster and more efficient way to get a response.  Please allow 48 business hours for a response.  Please remember that this is for non-urgent requests.   Due to recent changes in healthcare laws, you may see the results of your imaging and laboratory studies on MyChart before your provider has had a chance to review them.  We understand that in some cases there may be results that are confusing or concerning to you. Not all laboratory results come back in the same time frame and the  provider may be waiting for multiple results in order to interpret others.  Please give [place] 48 hours in order for your provider to thoroughly review all the results before contacting the office for clarification of your results.     Thank you for choosing me and [facility].  Vito Cirigliano, D.O.

## 2022-11-30 ENCOUNTER — Encounter: Payer: Self-pay | Admitting: Gastroenterology

## 2022-11-30 ENCOUNTER — Ambulatory Visit (AMBULATORY_SURGERY_CENTER): Payer: PPO | Admitting: Gastroenterology

## 2022-11-30 VITALS — BP 135/69 | HR 57 | Temp 97.1°F | Resp 12 | Ht 64.0 in | Wt 146.0 lb

## 2022-11-30 DIAGNOSIS — K2970 Gastritis, unspecified, without bleeding: Secondary | ICD-10-CM | POA: Diagnosis not present

## 2022-11-30 DIAGNOSIS — K319 Disease of stomach and duodenum, unspecified: Secondary | ICD-10-CM | POA: Diagnosis not present

## 2022-11-30 DIAGNOSIS — R1310 Dysphagia, unspecified: Secondary | ICD-10-CM

## 2022-11-30 DIAGNOSIS — K31819 Angiodysplasia of stomach and duodenum without bleeding: Secondary | ICD-10-CM | POA: Diagnosis not present

## 2022-11-30 DIAGNOSIS — R933 Abnormal findings on diagnostic imaging of other parts of digestive tract: Secondary | ICD-10-CM | POA: Diagnosis not present

## 2022-11-30 DIAGNOSIS — I10 Essential (primary) hypertension: Secondary | ICD-10-CM | POA: Diagnosis not present

## 2022-11-30 MED ORDER — PANTOPRAZOLE SODIUM 40 MG PO TBEC
DELAYED_RELEASE_TABLET | ORAL | 1 refills | Status: DC
Start: 2022-11-30 — End: 2023-03-05

## 2022-11-30 MED ORDER — SODIUM CHLORIDE 0.9 % IV SOLN
500.0000 mL | Freq: Once | INTRAVENOUS | Status: DC
Start: 2022-11-30 — End: 2022-11-30

## 2022-11-30 NOTE — Progress Notes (Signed)
Uneventful anesthetic. Report to pacu rn. Vss. Care resumed by rn. 

## 2022-11-30 NOTE — Progress Notes (Signed)
Pt's states no medical or surgical changes since previsit or office visit. 

## 2022-11-30 NOTE — Progress Notes (Signed)
Called to room to assist during endoscopic procedure.  Patient ID and intended procedure confirmed with present staff. Received instructions for my participation in the procedure from the performing physician.  

## 2022-11-30 NOTE — Progress Notes (Signed)
GASTROENTEROLOGY PROCEDURE H&P NOTE   Primary Care Physician: Pincus Sanes, MD    Reason for Procedure:   Dysphagia, GERD, abnormal esophagram  Plan:    EGD with dilation and/or bxs  Patient is appropriate for endoscopic procedure(s) in the ambulatory ([facility]) setting.  The nature of the procedure, as well as the risks, benefits, and alternatives were carefully and thoroughly reviewed with the patient. Ample time for discussion and questions allowed. The patient understood, was satisfied, and agreed to proceed.     HPI: Danielle Fischer is a 72 y.o. female who presents for EGD for evaluation of dysphagia, abnormal esophagram, and GERD .  Patient was most recently seen in the Gastroenterology Clinic on 11/26/2022 by me.  No interval change in medical history since that appointment. Please refer to that note for full details regarding GI history and clinical presentation.   Past Medical History:  Diagnosis Date   Blood transfusion without reported diagnosis    Cataract    Chronic kidney disease    Cystocele    Heart murmur    Hypertension    Osteopenia    Rectocele    SUI (stress urinary incontinence, female)    Uterine prolapse     Past Surgical History:  Procedure Laterality Date   COLONOSCOPY  2009   negative; Dr Shirley Friar   VAGINAL HYSTERECTOMY  1993   A/P Repair    Prior to Admission medications   Medication Sig Start Date End Date Taking? Authorizing Provider  amitriptyline (ELAVIL) 25 MG tablet TAKE 1 TABLET(25 MG) BY MOUTH AT BEDTIME 06/11/22  Yes Burns, Bobette Mo, MD  metoprolol succinate (TOPROL-XL) 50 MG 24 hr tablet Take with or immediately following a meal. 06/11/22  Yes Burns, Bobette Mo, MD  metoprolol tartrate (LOPRESSOR) 50 MG tablet Take 50 mg by mouth daily.   Yes [provider]  valACYclovir (VALTREX) 1000 MG tablet Take 2 tablets (2,000 mg total) by mouth every 12 (twelve) hours as needed (for outbreaks). Take 2 pills x 1 as needed for  outbreaks then 2 pills 12 hours later, MDD 4 02/24/20   Burns, Bobette Mo, MD    Current Outpatient Medications  Medication Sig Dispense Refill   amitriptyline (ELAVIL) 25 MG tablet TAKE 1 TABLET(25 MG) BY MOUTH AT BEDTIME 90 tablet 1   metoprolol succinate (TOPROL-XL) 50 MG 24 hr tablet Take with or immediately following a meal. 90 tablet 1   metoprolol tartrate (LOPRESSOR) 50 MG tablet Take 50 mg by mouth daily.     valACYclovir (VALTREX) 1000 MG tablet Take 2 tablets (2,000 mg total) by mouth every 12 (twelve) hours as needed (for outbreaks). Take 2 pills x 1 as needed for outbreaks then 2 pills 12 hours later, MDD 4 40 tablet 5   Current Facility-Administered Medications  Medication Dose Route Frequency Provider Last Rate Last Admin   0.9 %  sodium chloride infusion  500 mL Intravenous Once ,  V, DO        Allergies as of 11/30/2022 - Review Complete 11/30/2022  Allergen Reaction Noted   Erythromycin  03/28/2021    Family History  Problem Relation Age of Onset   Hypertension Mother    Osteoporosis Mother    Heart disease Father        MI @ 15   ALS Father    Hypertension Brother    Prostate cancer Brother    Breast cancer Paternal Aunt    Osteoporosis Maternal Grandmother  Stroke Paternal Grandmother    Diabetes Neg Hx    Colon cancer Neg Hx    Esophageal cancer Neg Hx    Rectal cancer Neg Hx    Stomach cancer Neg Hx     Social History   Socioeconomic History   Marital status: Married    Spouse name: Not on file   Number of children: Not on file   Years of education: Not on file   Highest education level: Not on file  Occupational History   Not on file  Tobacco Use   Smoking status: Former   Smokeless tobacco: Never   Tobacco comments:    smoked ages [age], up to 5 cigarettes/day  Substance and Sexual Activity   Alcohol use: Yes    Alcohol/week: 4.0 standard drinks of alcohol    Types: 4 Glasses of wine per week    Comment:  socially on  weekends   Drug use: No   Sexual activity: Yes    Birth control/protection: Surgical  Other Topics Concern   Not on file  Social History Narrative   No regular exercise   Social Determinants of Health   Financial Resource Strain: Low Risk  (03/23/2022)   Overall Financial Resource Strain (CARDIA)    Difficulty of Paying Living Expenses: Not hard at all  Food Insecurity: Low Risk  (08/23/2022)   [facility]   Food vital sign    Within the past 12 months, you worried that your food would run out before you got money to buy more: Never true    Within the past 12 months, the food you bought just didn't last and you didn't have money to get more. : Never true  Transportation Needs: No Transportation Needs (08/23/2022)   [facility]   Transportation    In the past 12 months, has lack of reliable transportation kept you from medical appointments, meetings, work or from getting things needed for daily living? : No  Physical Activity: Sufficiently Active (03/23/2022)   Exercise Vital Sign    Days of Exercise per Week: 5 days    Minutes of Exercise per Session: 40 min  Stress: No Stress Concern Present (03/23/2022)   [employer] of Occupational Health - Occupational Stress Questionnaire    Feeling of Stress : Not at all  Social Connections: Unknown (09/24/2022)   [facility]   Social Network    Social Network: Not on file  Intimate Partner Violence: Unknown (09/24/2022)   Received from [employer], [facility]   HITS    Physically Hurt: Not on file    Insult or Talk Down To: Not on file    Threaten Physical Harm: Not on file    Scream or Curse: Not on file    Physical Exam: Vital signs in last 24 hours: @BP  (!) 157/87   Pulse 72   Temp (!) 97.1 F (36.2 C)   Ht 5\' 4"  (1.626 m)   Wt 146 lb (66.2 kg)   SpO2 100%   BMI 25.06 kg/m  GEN: NAD EYE: Sclerae anicteric ENT: MMM CV:  Non-tachycardic Pulm: CTA b/l GI: Soft, NT/ND NEURO:  Alert & Oriented x 3   Doristine Locks, DO [facility]   11/30/2022 3:18 PM

## 2022-11-30 NOTE — Patient Instructions (Signed)
Discharge instructions given. Handouts on Gastritis and Post Dilatation diet. Resume previous medications. Prescription sent to pharmacy. Follow up appointment scheduled. YOU HAD AN ENDOSCOPIC PROCEDURE TODAY AT THE [facility]:   Refer to the procedure report that was given to you for any specific questions about what was found during the examination.  If the procedure report does not answer your questions, please call your gastroenterologist to clarify.  If you requested that your care partner not be given the details of your procedure findings, then the procedure report has been included in a sealed envelope for you to review at your convenience later.  YOU SHOULD EXPECT: Some feelings of bloating in the abdomen. Passage of more gas than usual.  Walking can help get rid of the air that was put into your GI tract during the procedure and reduce the bloating. If you had a lower endoscopy (such as a colonoscopy or flexible sigmoidoscopy) you may notice spotting of blood in your stool or on the toilet paper. If you underwent a bowel prep for your procedure, you may not have a normal bowel movement for a few days.  Please Note:  You might notice some irritation and congestion in your nose or some drainage.  This is from the oxygen used during your procedure.  There is no need for concern and it should clear up in a day or so.  SYMPTOMS TO REPORT IMMEDIATELY:   Following upper endoscopy (EGD)  Vomiting of blood or coffee ground material  New chest pain or pain under the shoulder blades  Painful or persistently difficult swallowing  New shortness of breath  Fever of 100F or higher  Black, tarry-looking stools  For urgent or emergent issues, a gastroenterologist can be reached at any hour by calling [phone]. Do not use MyChart messaging for urgent concerns.    DIET:  We do recommend a small meal at first, but then you may proceed to your regular diet.  Drink plenty of fluids  but you should avoid alcoholic beverages for 24 hours.  ACTIVITY:  You should plan to take it easy for the rest of today and you should NOT DRIVE or use heavy machinery until tomorrow (because of the sedation medicines used during the test).    FOLLOW UP: Our staff will call the number listed on your records the next business day following your procedure.  We will call around 7:15- 8:00 am to check on you and address any questions or concerns that you may have regarding the information given to you following your procedure. If we do not reach you, we will leave a message.     If any biopsies were taken you will be contacted by phone or by letter within the next 1-3 weeks.  Please call us at [phone] if you have not heard about the biopsies in 3 weeks.    SIGNATURES/CONFIDENTIALITY: You and/or your care partner have signed paperwork which will be entered into your electronic medical record.  These signatures attest to the fact that that the information above on your After Visit Summary has been reviewed and is understood.  Full responsibility of the confidentiality of this discharge information lies with you and/or your care-partner.

## 2022-11-30 NOTE — Op Note (Signed)
[facility] Patient Name: Danielle Fischer Procedure Date: 11/30/2022 3:18 PM MRN: [id number] Endoscopist: Doristine Locks , MD, [id number] Age: 72 Referring MD:  Date of Birth: [birth year] Gender: Female Account #: [id number] Procedure:                Upper GI endoscopy Indications:              Dysphagia, Suspected esophageal reflux, Abnormal                            cine-esophagram Medicines:                Monitored Anesthesia Care Procedure:                Pre-Anesthesia Assessment:                           - Prior to the procedure, a History and Physical                            was performed, and patient medications and                            allergies were reviewed. The patient's tolerance of                            previous anesthesia was also reviewed. The risks                            and benefits of the procedure and the sedation                            options and risks were discussed with the patient.                            All questions were answered, and informed consent                            was obtained. Prior Anticoagulants: The patient has                            taken no anticoagulant or antiplatelet agents. ASA                            Grade Assessment: II - A patient with mild systemic                            disease. After reviewing the risks and benefits,                            the patient was deemed in satisfactory condition to                            undergo the procedure.  After obtaining informed consent, the endoscope was                            passed under direct vision. Throughout the                            procedure, the patient's blood pressure, pulse, and                            oxygen saturations were monitored continuously. The                            Olympus Scope [id number] was introduced through the                            mouth, and advanced to the second part  of duodenum.                            The upper GI endoscopy was accomplished without                            difficulty. The patient tolerated the procedure                            well. The GIF [id number] #[id number] was introduced through                            the mouth, and advanced to the second part of                            duodenum. Scope In: Scope Out: Findings:                 The examined esophagus was normal. A guidewire was                            placed and the scope was withdrawn. Dilation was                            performed with a Savary dilator with mild                            resistance at 17 mm. The dilation site was examined                            following endoscope reinsertion and showed no                            bleeding, mucosal tear or perforation. Biopsies                            were obtained from the proximal and distal  esophagus with cold forceps for histology of                            suspected eosinophilic esophagitis. Estimated blood                            loss was minimal.                           The Z-line was regular and was found 38 cm from the                            incisors.                           Moderate inflammation characterized by striped                            erythema was found in the gastric antrum. Biopsies                            were taken with a cold forceps for histology,                            Helicobacter pylori testing, and histologic                            evaluation for GAVE. Estimated blood loss was                            minimal.                           Scattered mild inflammation characterized by                            erythema was found in the gastric body. Biopsies                            were taken with a cold forceps for histology and                            Helicobacter pylori testing. Estimated blood loss                             was minimal.                           The examined duodenum was normal. Complications:            No immediate complications. Estimated Blood Loss:     Estimated blood loss was minimal. Impression:               - Normal esophagus. Dilated with 17 mm Savary  dilator. Biopsies were taken with a cold forceps                            for evaluation of eosinophilic esophagitis.                           - Z-line regular, 38 cm from the incisors.                           - Moderate antral gastritis in a striped                            appearance, most consistent with GAVE. Biopsied.                           - Mild, patchy, non-ulcer gastritis in the gastric                            body. Biopsied.                           - Normal examined duodenum. Recommendation:           - Patient has a contact number available for                            emergencies. The signs and symptoms of potential                            delayed complications were discussed with the                            patient. Return to normal activities tomorrow.                            Written discharge instructions were provided to the                            patient.                           - Resume previous diet.                           - Continue present medications.                           - Await pathology results.                           - Use Protonix (pantoprazole) 40 mg PO BID for 4                            weeks to promote mucosal healing and  diagnostic/therapeutic intent of reflux symptoms.                            If symptoms controlled, will reduce to 40 mg daily.                           - Follow-up with me in the GI clinic in                            approximately 3-4 months. Doristine Locks, MD 11/30/2022 3:57:30 PM

## 2022-12-04 ENCOUNTER — Telehealth: Payer: Self-pay | Admitting: *Deleted

## 2022-12-04 NOTE — Telephone Encounter (Signed)
  Follow up Call-     11/30/2022    3:02 PM 12/07/2021   11:14 AM  Call back number  Post procedure Call Back phone  # [phone]  Permission to leave phone message Yes Yes     Patient questions:  Do you have a fever, pain , or abdominal swelling? No. Pain Score  0 *  Have you tolerated food without any problems? Yes.    Have you been able to return to your normal activities? Yes.    Do you have any questions about your discharge instructions: Diet   No. Medications  No. Follow up visit  No.  Do you have questions or concerns about your Care? No.  Actions: * If pain score is 4 or above: No action needed, pain <4.

## 2022-12-06 ENCOUNTER — Other Ambulatory Visit: Payer: Self-pay | Admitting: Internal Medicine

## 2022-12-11 ENCOUNTER — Encounter: Payer: Self-pay | Admitting: Internal Medicine

## 2022-12-11 ENCOUNTER — Ambulatory Visit (INDEPENDENT_AMBULATORY_CARE_PROVIDER_SITE_OTHER): Payer: PPO | Admitting: Internal Medicine

## 2022-12-11 VITALS — BP 122/78 | HR 97 | Temp 98.0°F | Ht 64.0 in | Wt 145.0 lb

## 2022-12-11 DIAGNOSIS — I10 Essential (primary) hypertension: Secondary | ICD-10-CM

## 2022-12-11 DIAGNOSIS — R000 Tachycardia, unspecified: Secondary | ICD-10-CM | POA: Diagnosis not present

## 2022-12-11 DIAGNOSIS — G90A Postural orthostatic tachycardia syndrome (POTS): Secondary | ICD-10-CM | POA: Insufficient documentation

## 2022-12-11 NOTE — Progress Notes (Signed)
Subjective:    Patient ID: Danielle Fischer, female    DOB: April 28, 1950, 72 y.o.   MRN: [id number]      HPI Danielle Fischer is here for  Chief Complaint  Patient presents with   Medical Management of Chronic Issues    Patient wants to discuss current medical conditions and wants to address a few things to see what you think    Started having symptoms around 62-42 yrs of age.  Had epigastric/LUQ discomfort, tightness/weakness feeling in upper back, neck and posterior headaches, malaise, not feeling well.    Had testing done - started on stomach medication - pain improved.  W/u negative.  Started on amitriptyline and symptoms improved.   Over the years had intermittent symptoms - may last a week - would usually feel nauseous /sick and take pepcid x few days and sometimes increase amitriptyline to 50 mg and symptoms would improve.    Recently had some pill dysphagia - saw ENT - advised swallow eval - referred to GI for GERD she did not know she had.  Started on protonix  for gastritis.   She would times note lightheadedness but not consistently.  She would not like standing of long periods - would have to bend over and put her hands on her knees for a couple of minutes and would feel better.  She soul get transient tightness/weakness in upper back/neck when walking across her house   Sister has same symptoms in last 6-7 years  She recently noticed her HR would jump up ( 30 beats) when she was walking in her house --- wondered about POTs    Sometimes lightheaded when standing up or shortly after   Weakness and change in breathing when walking.     Not currently exercising  Goodwith fluids - water    Medications and allergies reviewed with patient and updated if appropriate.  Current Outpatient Medications on File Prior to Visit  Medication Sig Dispense Refill   amitriptyline (ELAVIL) 25 MG tablet TAKE 1 TABLET BY MOUTH AT BEDTIME 90 tablet 0   losartan (COZAAR) 50 MG tablet Take  50 mg by mouth daily.     metoprolol succinate (TOPROL-XL) 50 MG 24 hr tablet TAKE 1 TABLET BY MOUTH DAILY WITH OR IMMEDIATELY FOLLOWING A MEAL 90 tablet 1   metoprolol tartrate (LOPRESSOR) 50 MG tablet Take 50 mg by mouth daily.     pantoprazole (PROTONIX) 40 MG tablet Protonix 40mg  po BID for 4 weeks to promote mucosal healing and will reduce to 40 mg daily. 60 tablet 1   valACYclovir (VALTREX) 1000 MG tablet Take 2 tablets (2,000 mg total) by mouth every 12 (twelve) hours as needed (for outbreaks). Take 2 pills x 1 as needed for outbreaks then 2 pills 12 hours later, MDD 4 40 tablet 5   No current facility-administered medications on file prior to visit.    Review of Systems  Constitutional:  Positive for fatigue. Negative for diaphoresis.  Respiratory:  Positive for shortness of breath (occ with with flare - weakness in neck/upper back).   Cardiovascular:  Negative for chest pain, palpitations and leg swelling.  Musculoskeletal:  Positive for neck pain.  Neurological:  Positive for light-headedness (sometimes) and headaches.  Psychiatric/Behavioral:  The patient is not nervous/anxious.        Objective:   Vitals:   12/11/22 1552  BP: 122/78  Pulse: 97  Temp: 98 F (36.7 C)  SpO2: 99%   BP Readings from Last 3 Encounters:  12/11/22 122/78  11/30/22 135/69  11/26/22 128/82   Wt Readings from Last 3 Encounters:  12/11/22 145 lb (65.8 kg)  11/30/22 146 lb (66.2 kg)  11/26/22 146 lb 2 oz (66.3 kg)   Body mass index is 24.89 kg/m.    Physical Exam Constitutional:      General: She is not in acute distress.    Appearance: Normal appearance.  HENT:     Head: Normocephalic and atraumatic.  Eyes:     Conjunctiva/sclera: Conjunctivae normal.  Cardiovascular:     Rate and Rhythm: Normal rate and regular rhythm.     Heart sounds: Normal heart sounds.  Pulmonary:     Effort: Pulmonary effort is normal. No respiratory distress.     Breath sounds: Normal breath sounds. No  wheezing.  Musculoskeletal:     Cervical back: Neck supple.     Right lower leg: No edema.     Left lower leg: No edema.  Lymphadenopathy:     Cervical: No cervical adenopathy.  Skin:    General: Skin is warm and dry.     Findings: No rash.  Neurological:     Mental Status: She is alert. Mental status is at baseline.  Psychiatric:        Mood and Affect: Mood normal.        Behavior: Behavior normal.            Assessment & Plan:    See Problem List for Assessment and Plan of chronic medical problems.

## 2022-12-11 NOTE — Assessment & Plan Note (Addendum)
Tachycardia with standing often - sometimes associated with lightheadedness Also with episodic nausea, epigastric discomfort, malaise, tightness/weakness upper back/neck and posterior headaches, not feeling well I agree with her this sounds like POTs Echo ordered Will schedule free consultation with Dr Theador Hawthorne in [facility] cardiology referral - she will see what Dr Manson Passey advises Increase amitriptyline to 50 mg nightly to see if that helps -- she will let me know

## 2022-12-11 NOTE — Assessment & Plan Note (Signed)
Chronic BP well controlled Continue metoprolol xl 50 mg daily, losartan 50 mg daily

## 2022-12-11 NOTE — Patient Instructions (Addendum)
    Medications changes include :   increase amitriptyline to 50 mg     An Echo was ordered.   Call us today at [phone] or click the button below to schedule a complimentary 15-minute phone consultation with our lead POTS specialist, Dr. Theador Hawthorne, to see how our services may be right for you or a loved one.

## 2022-12-31 ENCOUNTER — Encounter (HOSPITAL_COMMUNITY): Payer: Self-pay | Admitting: Internal Medicine

## 2023-01-08 ENCOUNTER — Telehealth (HOSPITAL_COMMUNITY): Payer: Self-pay | Admitting: Internal Medicine

## 2023-01-08 NOTE — Telephone Encounter (Signed)
Just an FYI. We have made several attempts to contact this patient including sending a letter to schedule or reschedule their echocardiogram. We will be removing the patient from the echo WQ.   12/31/22 MAILED LETTER AND COPIED MD 12/26/22 [facility] to schedule x 3 @ 10:52/LBW 12/19/22 [facility] to schedule @ 10:10/LBW 12/12/22 [facility] to schedule @ 9:15/LBW        Thank you

## 2023-01-10 DIAGNOSIS — N1832 Chronic kidney disease, stage 3b: Secondary | ICD-10-CM | POA: Diagnosis not present

## 2023-01-14 DIAGNOSIS — H43391 Other vitreous opacities, right eye: Secondary | ICD-10-CM | POA: Diagnosis not present

## 2023-01-14 DIAGNOSIS — H2512 Age-related nuclear cataract, left eye: Secondary | ICD-10-CM | POA: Diagnosis not present

## 2023-01-14 DIAGNOSIS — H43812 Vitreous degeneration, left eye: Secondary | ICD-10-CM | POA: Diagnosis not present

## 2023-01-17 DIAGNOSIS — R7303 Prediabetes: Secondary | ICD-10-CM | POA: Diagnosis not present

## 2023-01-17 DIAGNOSIS — I129 Hypertensive chronic kidney disease with stage 1 through stage 4 chronic kidney disease, or unspecified chronic kidney disease: Secondary | ICD-10-CM | POA: Diagnosis not present

## 2023-01-17 DIAGNOSIS — E559 Vitamin D deficiency, unspecified: Secondary | ICD-10-CM | POA: Diagnosis not present

## 2023-01-17 DIAGNOSIS — N1832 Chronic kidney disease, stage 3b: Secondary | ICD-10-CM | POA: Diagnosis not present

## 2023-02-08 ENCOUNTER — Telehealth: Payer: Self-pay | Admitting: Internal Medicine

## 2023-02-08 ENCOUNTER — Other Ambulatory Visit: Payer: Self-pay | Admitting: Internal Medicine

## 2023-02-08 MED ORDER — AMITRIPTYLINE HCL 50 MG PO TABS: 50.0000 mg | ORAL_TABLET | Freq: Every day | ORAL | 1 refills | Status: DC

## 2023-02-08 NOTE — Telephone Encounter (Signed)
Prescription Request  02/08/2023  LOV: 12/11/2022  What is the name of the medication or equipment? amitriptyline (ELAVIL) 25 MG tablet   Pt stated the Rx suppose to be up to 50mg   Have you contacted your pharmacy to request a refill? No   Which pharmacy would you like this sent to?    [facility] [id number] [place], [place] - [address] [place] [place] [address] Phone: [phone] Fax: [phone]   Patient notified that their request is being sent to the clinical staff for review and that they should receive a response within 2 business days.   Please advise at Mobile [phone] (mobile)

## 2023-03-04 ENCOUNTER — Other Ambulatory Visit: Payer: Self-pay | Admitting: Internal Medicine

## 2023-03-04 ENCOUNTER — Other Ambulatory Visit: Payer: Self-pay | Admitting: Gastroenterology

## 2023-03-04 DIAGNOSIS — K2970 Gastritis, unspecified, without bleeding: Secondary | ICD-10-CM

## 2023-03-26 ENCOUNTER — Encounter: Payer: Self-pay | Admitting: Internal Medicine

## 2023-03-26 ENCOUNTER — Ambulatory Visit: Payer: PPO | Admitting: Internal Medicine

## 2023-03-26 VITALS — BP 124/80 | HR 78 | Temp 98.1°F | Ht 64.0 in | Wt 149.0 lb

## 2023-03-26 DIAGNOSIS — J069 Acute upper respiratory infection, unspecified: Secondary | ICD-10-CM | POA: Insufficient documentation

## 2023-03-26 NOTE — Patient Instructions (Addendum)
   Medications changes include :   None      Return if symptoms worsen or fail to improve.

## 2023-03-26 NOTE — Progress Notes (Signed)
Subjective:    Patient ID: Danielle Fischer, female    DOB: 26-Dec-1950, 72 y.o.   MRN: [id number]      HPI Jiani is here for  Chief Complaint  Patient presents with   Cough    Cough x 2 weeks     She is here for an acute visit for cold symptoms.   Her symptoms started x 2 weeks.   She is experiencing pnd, cough and lightheadedness.  Her cough was severe for a few days.  She does feel better today.  No other symptoms.  She has tried taking delsym      Medications and allergies reviewed with patient and updated if appropriate.  Current Outpatient Medications on File Prior to Visit  Medication Sig Dispense Refill   amitriptyline (ELAVIL) 50 MG tablet Take 1 tablet (50 mg total) by mouth at bedtime. 90 tablet 1   losartan (COZAAR) 50 MG tablet Take 50 mg by mouth daily.     metoprolol succinate (TOPROL-XL) 50 MG 24 hr tablet TAKE 1 TABLET BY MOUTH DAILY WITH OR IMMEDIATELY FOLLOWING A MEAL 90 tablet 1   pantoprazole (PROTONIX) 40 MG tablet Take 1 tablet (40 mg total) by mouth daily. 30 tablet 2   valACYclovir (VALTREX) 1000 MG tablet Take 2 tablets (2,000 mg total) by mouth every 12 (twelve) hours as needed (for outbreaks). Take 2 pills x 1 as needed for outbreaks then 2 pills 12 hours later, MDD 4 40 tablet 5   No current facility-administered medications on file prior to visit.    Review of Systems  Constitutional:  Negative for fever.  HENT:  Positive for postnasal drip (thick). Negative for congestion, ear pain, rhinorrhea, sinus pressure, sinus pain and sore throat.   Respiratory:  Positive for cough. Negative for chest tightness, shortness of breath and wheezing.   Gastrointestinal:  Negative for diarrhea and nausea.  Musculoskeletal:  Negative for myalgias.  Neurological:  Positive for light-headedness (occ). Negative for headaches.       Objective:   Vitals:   03/26/23 1456  BP: 124/80  Pulse: 78  Temp: 98.1 F (36.7 C)  SpO2: 95%   BP Readings from  Last 3 Encounters:  03/26/23 124/80  12/11/22 122/78  11/30/22 135/69   Wt Readings from Last 3 Encounters:  03/26/23 149 lb (67.6 kg)  12/11/22 145 lb (65.8 kg)  11/30/22 146 lb (66.2 kg)   Body mass index is 25.58 kg/m.    Physical Exam Constitutional:      General: She is not in acute distress.    Appearance: Normal appearance. She is not ill-appearing.  HENT:     Head: Normocephalic and atraumatic.     Right Ear: Tympanic membrane, ear canal and external ear normal.     Left Ear: Tympanic membrane, ear canal and external ear normal.     Mouth/Throat:     Mouth: Mucous membranes are moist.     Pharynx: No oropharyngeal exudate or posterior oropharyngeal erythema.  Eyes:     Conjunctiva/sclera: Conjunctivae normal.  Cardiovascular:     Rate and Rhythm: Normal rate and regular rhythm.  Pulmonary:     Effort: Pulmonary effort is normal. No respiratory distress.     Breath sounds: Normal breath sounds. No wheezing or rales.  Musculoskeletal:     Cervical back: Neck supple. No tenderness.  Lymphadenopathy:     Cervical: No cervical adenopathy.  Skin:    General: Skin is warm and dry.  Neurological:  Mental Status: She is alert.            Assessment & Plan:    See Problem List for Assessment and Plan of chronic medical problems.

## 2023-03-26 NOTE — Assessment & Plan Note (Signed)
Acute Symptoms likely viral in nature Symptoms have improved Continue symptomatic treatment with over-the-counter cold medications, Tylenol/ibuprofen Increase rest and fluids Call if symptoms worsen or do not improve

## 2023-03-28 ENCOUNTER — Ambulatory Visit: Payer: PPO | Admitting: Gastroenterology

## 2023-03-29 ENCOUNTER — Ambulatory Visit (INDEPENDENT_AMBULATORY_CARE_PROVIDER_SITE_OTHER): Payer: PPO

## 2023-03-29 VITALS — Ht 64.0 in | Wt 149.0 lb

## 2023-03-29 DIAGNOSIS — Z0000 Encounter for general adult medical examination without abnormal findings: Secondary | ICD-10-CM | POA: Diagnosis not present

## 2023-03-29 NOTE — Progress Notes (Signed)
Subjective:   Danielle Fischer is a 72 y.o. female who presents for Medicare Annual (Subsequent) preventive examination.  Visit Complete: Virtual I connected with  Chua Savoy on 03/29/23 by a audio enabled telemedicine application and verified that I am speaking with the correct person using two identifiers.  Patient Location: Home  Provider Location: Office/Clinic  I discussed the limitations of evaluation and management by telemedicine. The patient expressed understanding and agreed to proceed.  Vital Signs: Because this visit was a virtual/telehealth visit, some criteria may be missing or patient reported. Any vitals not documented were not able to be obtained and vitals that have been documented are patient reported.  Patient Medicare AWV questionnaire was completed by the patient on 03/28/2023; I have confirmed that all information answered by patient is correct and no changes since this date.  Cardiac Risk Factors include: advanced age (>86men, >23 women);hypertension;Other (see comment);dyslipidemia, Risk factor comments: CKD, Osteopenia     Objective:    Today's Vitals   03/29/23 0947  Weight: 149 lb (67.6 kg)  Height: 5\' 4"  (1.626 m)   Body mass index is 25.58 kg/m.     03/29/2023    9:52 AM 03/23/2022    9:22 AM 03/15/2021    9:26 AM  Advanced Directives  Does Patient Have a Medical Advance Directive? No No No  Would patient like information on creating a medical advance directive?  No - Patient declined No - Patient declined    Current Medications (verified) Outpatient Encounter Medications as of 03/29/2023  Medication Sig   amitriptyline (ELAVIL) 50 MG tablet Take 1 tablet (50 mg total) by mouth at bedtime.   losartan (COZAAR) 50 MG tablet Take 50 mg by mouth daily.   metoprolol succinate (TOPROL-XL) 50 MG 24 hr tablet TAKE 1 TABLET BY MOUTH DAILY WITH OR IMMEDIATELY FOLLOWING A MEAL   pantoprazole (PROTONIX) 40 MG tablet Take 1 tablet (40 mg total) by mouth  daily.   valACYclovir (VALTREX) 1000 MG tablet Take 2 tablets (2,000 mg total) by mouth every 12 (twelve) hours as needed (for outbreaks). Take 2 pills x 1 as needed for outbreaks then 2 pills 12 hours later, MDD 4   No facility-administered encounter medications on file as of 03/29/2023.    Allergies (verified) Erythromycin   History: Past Medical History:  Diagnosis Date   Blood transfusion without reported diagnosis    Cataract    Chronic kidney disease    Cystocele    Heart murmur    Hypertension    Osteopenia    Rectocele    SUI (stress urinary incontinence, female)    Uterine prolapse    Past Surgical History:  Procedure Laterality Date   COLONOSCOPY  2009   negative; Dr Shirley Friar   VAGINAL HYSTERECTOMY  1993   A/P Repair   Family History  Problem Relation Age of Onset   Hypertension Mother    Osteoporosis Mother    Heart disease Father        MI @ 50   ALS Father    Hypertension Brother    Prostate cancer Brother    Breast cancer Paternal Aunt    Osteoporosis Maternal Grandmother    Stroke Paternal Grandmother    Diabetes Neg Hx    Colon cancer Neg Hx    Esophageal cancer Neg Hx    Rectal cancer Neg Hx    Stomach cancer Neg Hx    Social History   Socioeconomic History   Marital status:  Married    Spouse name: Aneta Mins   Number of children: 3   Years of education: Not on file   Highest education level: Not on file  Occupational History   Occupation: Retired  Tobacco Use   Smoking status: Former   Smokeless tobacco: Never   Tobacco comments:    smoked ages [age], up to 5 cigarettes/day  Vaping Use   Vaping status: Never Used  Substance and Sexual Activity   Alcohol use: Yes    Alcohol/week: 4.0 standard drinks of alcohol    Types: 4 Glasses of wine per week    Comment:  socially on weekends   Drug use: No   Sexual activity: Yes    Birth control/protection: Surgical  Other Topics Concern   Not on file  Social History Narrative    No regular exercise   Lives with husband.   Social Determinants of Health   Financial Resource Strain: Low Risk  (03/28/2023)   Overall Financial Resource Strain (CARDIA)    Difficulty of Paying Living Expenses: Not hard at all  Food Insecurity: No Food Insecurity (03/28/2023)   Hunger Vital Sign    Worried About Running Out of Food in the Last Year: Never true    Ran Out of Food in the Last Year: Never true  Transportation Needs: No Transportation Needs (03/28/2023)   PRAPARE - Administrator, Civil Service (Medical): No    Lack of Transportation (Non-Medical): No  Physical Activity: Sufficiently Active (03/23/2022)   Exercise Vital Sign    Days of Exercise per Week: 5 days    Minutes of Exercise per Session: 40 min  Stress: No Stress Concern Present (03/28/2023)   [employer] of Occupational Health - Occupational Stress Questionnaire    Feeling of Stress : Not at all  Social Connections: Unknown (03/28/2023)   Social Connection and Isolation Panel [NHANES]    Frequency of Communication with Friends and Family: More than three times a week    Frequency of Social Gatherings with Friends and Family: Twice a week    Attends Religious Services: Not on Marketing executive or Organizations: Yes    Attends Engineer, structural: More than 4 times per year    Marital Status: Married    Tobacco Counseling Counseling given: Not Answered Tobacco comments: smoked ages [age], up to 5 cigarettes/day   Clinical Intake:  Pre-visit preparation completed: Yes  Pain : No/denies pain     BMI - recorded: 25.58 Nutritional Status: BMI 25 -29 Overweight Nutritional Risks: None Diabetes: No  How often do you need to have someone help you when you read instructions, pamphlets, or other written materials from your doctor or pharmacy?: 1 - Never  Interpreter Needed?: No  Information entered by :: Azzie Thiem, RMA   Activities of Daily Living     03/28/2023    1:48 PM  In your present state of health, do you have any difficulty performing the following activities:  Hearing? 0  Difficulty concentrating or making decisions? 0  Walking or climbing stairs? 0  Dressing or bathing? 0  Doing errands, shopping? 0  Preparing Food and eating ? N  Using the Toilet? N  In the past six months, have you accidently leaked urine? N  Do you have problems with loss of bowel control? N  Managing your Medications? N  Managing your Finances? N  Housekeeping or managing your Housekeeping? N    Patient Care Team: Lawerance Bach,  Bobette Mo, MD as PCP - General (Internal Medicine) Sallye Lat, MD as Consulting Physician (Ophthalmology)  Indicate any recent Medical Services you may have received from other than [facility] providers in the past year (date may be approximate).     Assessment:   This is a routine wellness examination for Filomena.  Hearing/Vision screen Hearing Screening - Comments:: Denies hearing difficulties   Vision Screening - Comments:: Wears eyeglasses- for reading   Goals Addressed             This Visit's Progress    Patient Stated   Not on track    I would like like to lose 5-8 pounds by eating small low calorie meals frequently and by continuing to exercise.       Depression Screen    03/29/2023    9:55 AM 03/26/2023    3:01 PM 05/17/2022    9:15 AM 03/23/2022    9:36 AM 03/15/2021    9:32 AM 02/24/2020    1:11 PM 07/22/2017   10:37 AM  PHQ 2/9 Scores  PHQ - 2 Score 0 0 0 0 0 0 0  PHQ- 9 Score 0 0 0        Fall Risk    03/28/2023    1:48 PM 03/26/2023    3:00 PM 05/17/2022    9:15 AM 03/23/2022    9:23 AM 03/15/2021    9:28 AM  Fall Risk   Falls in the past year? 0 0 0 1 0  Number falls in past yr: 0 0 0 0 0  Injury with Fall?  0 0 0 1  Risk for fall due to :  No Fall Risks No Fall Risks No Fall Risks No Fall Risks  Follow up Falls evaluation completed;Falls prevention discussed Falls evaluation completed Falls  evaluation completed Falls evaluation completed Falls evaluation completed    MEDICARE RISK AT HOME: Medicare Risk at Home Any stairs in or around the home?: No If so, are there any without handrails?: No Home free of loose throw rugs in walkways, pet beds, electrical cords, etc?: Yes Adequate lighting in your home to reduce risk of falls?: Yes Life alert?: No Use of a cane, walker or w/c?: No Grab bars in the bathroom?: Yes Shower chair or bench in shower?: No Elevated toilet seat or a handicapped toilet?: No  TIMED UP AND GO:  Was the test performed?  No    Cognitive Function:        03/29/2023    9:45 AM 03/23/2022    9:24 AM  6CIT Screen  What Year? 0 points 0 points  What month? 0 points 0 points  What time? 0 points 0 points  Count back from 20 0 points 0 points  Months in reverse 0 points 0 points  Repeat phrase 0 points 0 points  Total Score 0 points 0 points    Immunizations Immunization History  Administered Date(s) Administered   Fluad Quad(high Dose 65+) 02/24/2020, 04/10/2022   Influenza, High Dose Seasonal PF 02/21/2018, 02/26/2021   Influenza-Unspecified 01/23/2015, 02/20/2017   PFIZER(Purple Top)SARS-COV-2 Vaccination 05/30/2019, 06/20/2019, 03/13/2020   Pneumococcal Conjugate-13 05/15/2016   Pneumococcal Polysaccharide-23 07/22/2017   Td 04/23/2002   Zoster Recombinant(Shingrix) 02/26/2021, 05/05/2021    TDAP status: Due, Education has been provided regarding the importance of this vaccine. Advised may receive this vaccine at local pharmacy or Health Dept. Aware to provide a copy of the vaccination record if obtained from local pharmacy or Health Dept. Verbalized acceptance  and understanding.  Flu Vaccine status: Due, Education has been provided regarding the importance of this vaccine. Advised may receive this vaccine at local pharmacy or Health Dept. Aware to provide a copy of the vaccination record if obtained from local pharmacy or Health Dept.  Verbalized acceptance and understanding.  Pneumococcal vaccine status: Up to date  Covid-19 vaccine status: Declined, Education has been provided regarding the importance of this vaccine but patient still declined. Advised may receive this vaccine at local pharmacy or Health Dept.or vaccine clinic. Aware to provide a copy of the vaccination record if obtained from local pharmacy or Health Dept. Verbalized acceptance and understanding.  Qualifies for Shingles Vaccine? Yes   Zostavax completed Yes   Shingrix Completed?: Yes  Screening Tests Health Maintenance  Topic Date Due   COVID-19 Vaccine (4 - 2023-24 season) 04/14/2023 (Originally 12/23/2022)   DTaP/Tdap/Td (2 - Tdap) 05/24/2023 (Originally 04/23/2012)   INFLUENZA VACCINE  07/22/2023 (Originally 11/22/2022)   Medicare Annual Wellness (AWV)  03/28/2024   DEXA SCAN  03/31/2024   MAMMOGRAM  08/30/2024   Colonoscopy  12/07/2024   Pneumonia Vaccine 31+ Years old  Completed   Hepatitis C Screening  Completed   Zoster Vaccines- Shingrix  Completed   HPV VACCINES  Aged Out    Health Maintenance  There are no preventive care reminders to display for this patient.   Colorectal cancer screening: Type of screening: Colonoscopy. Completed 12/07/2021. Repeat every 3 years  Mammogram status: Completed 08/31/2022. Repeat every year  Bone Density status: Completed 03/31/2021. Results reflect: Bone density results: OSTEOPENIA. Repeat every 5 years.  Lung Cancer Screening: (Low Dose CT Chest recommended if Age 19-80 years, 20 pack-year currently smoking OR have quit w/in 15years.) does not qualify.   Lung Cancer Screening Referral: N/A  Additional Screening:  Hepatitis C Screening: does qualify; Completed 05/15/2016  Vision Screening: Recommended annual ophthalmology exams for early detection of glaucoma and other disorders of the eye. Is the patient up to date with their annual eye exam?  Yes  Who is the provider or what is the name of the  office in which the patient attends annual eye exams? Dr. Dione Booze If pt is not established with a provider, would they like to be referred to a provider to establish care? No .   Dental Screening: Recommended annual dental exams for proper oral hygiene   Community Resource Referral / Chronic Care Management: CRR required this visit?  No   CCM required this visit?  No     Plan:     I have personally reviewed and noted the following in the patient's chart:   Medical and social history Use of alcohol, tobacco or illicit drugs  Current medications and supplements including opioid prescriptions. Patient is not currently taking opioid prescriptions. Functional ability and status Nutritional status Physical activity Advanced directives List of other physicians Hospitalizations, surgeries, and ER visits in previous 12 months Vitals Screenings to include cognitive, depression, and falls Referrals and appointments  In addition, I have reviewed and discussed with patient certain preventive protocols, quality metrics, and best practice recommendations. A written personalized care plan for preventive services as well as general preventive health recommendations were provided to patient.     Ura Hausen L Purnell Daigle, CMA   03/29/2023   After Visit Summary: (MyChart) Due to this being a telephonic visit, the after visit summary with patients personalized plan was offered to patient via MyChart   Nurse Notes: Patient is due for a Flu vaccine and a  Tdap.  She does decline the Covid vaccine.  She also declines getting a DEXA this year, for now.  Patient will call the office to schedule her annual visit with Dr. Lawerance Bach soon.  Patient stated that she will discuss with Dr. Lawerance Bach, is this visit (AWV) useful for providers, during her annual visit coming up in January.  She had no concerns to address today.

## 2023-03-29 NOTE — Patient Instructions (Signed)
Ms. Friebel , Thank you for taking time to come for your Medicare Wellness Visit. I appreciate your ongoing commitment to your health goals. Please review the following plan we discussed and let me know if I can assist you in the future.   Referrals/Orders/Follow-Ups/Clinician Recommendations: You are due for a Flu vaccine and a Tetanus vaccine.  Remember to call the office to schedule your annual with Dr. Lawerance Bach.  It was nice talking with you today and Altamese Cabal Christmas to you.  This is a list of the screening recommended for you and due dates:  Health Maintenance  Topic Date Due   COVID-19 Vaccine (4 - 2023-24 season) 04/14/2023*   DTaP/Tdap/Td vaccine (2 - Tdap) 05/24/2023*   Flu Shot  07/22/2023*   Medicare Annual Wellness Visit  03/28/2024   DEXA scan (bone density measurement)  03/31/2024   Mammogram  08/30/2024   Colon Cancer Screening  12/07/2024   Pneumonia Vaccine  Completed   Hepatitis C Screening  Completed   Zoster (Shingles) Vaccine  Completed   HPV Vaccine  Aged Out  *Topic was postponed. The date shown is not the original due date.    Advanced directives: (Declined) Advance directive discussed with you today. Even though you declined this today, please call our office should you change your mind, and we can give you the proper paperwork for you to fill out.  Next Medicare Annual Wellness Visit scheduled for next year: Yes

## 2023-04-25 ENCOUNTER — Encounter: Payer: Self-pay | Admitting: Gastroenterology

## 2023-04-25 ENCOUNTER — Ambulatory Visit: Payer: PPO | Admitting: Gastroenterology

## 2023-04-25 VITALS — BP 140/70 | HR 84 | Ht 64.0 in | Wt 151.0 lb

## 2023-04-25 DIAGNOSIS — R1310 Dysphagia, unspecified: Secondary | ICD-10-CM

## 2023-04-25 DIAGNOSIS — Z860100 Personal history of colon polyps, unspecified: Secondary | ICD-10-CM | POA: Diagnosis not present

## 2023-04-25 DIAGNOSIS — K219 Gastro-esophageal reflux disease without esophagitis: Secondary | ICD-10-CM | POA: Diagnosis not present

## 2023-04-25 DIAGNOSIS — R09A2 Foreign body sensation, throat: Secondary | ICD-10-CM

## 2023-04-25 DIAGNOSIS — R1013 Epigastric pain: Secondary | ICD-10-CM

## 2023-04-25 NOTE — Patient Instructions (Addendum)
 _______________________________________________________  If your blood pressure at your visit was 140/90 or greater, please contact your primary care physician to follow up on this. _______________________________________________________  If you are age 74 or older, your body mass index should be between 23-30. Your Body mass index is 25.92 kg/m. If this is out of the aforementioned range listed, please consider follow up with your Primary Care Provider. ________________________________________________________  The [facility] providers would like to encourage you to use MYCHART to communicate with providers for non-urgent requests or questions.  Due to long hold times on the telephone, sending your provider a message by [facility] may be a faster and more efficient way to get a response.  Please allow 48 business hours for a response.  Please remember that this is for non-urgent requests.  _______________________________________________________  We have sent the following medications to your pharmacy for you to pick up at your convenience:   CONTINUE: Protonix  40mg  once daily  You will follow up in our office in 1 year.  We will contact you to schedule this appointment.  It was a pleasure to see you today!  Vito Cirigliano, D.O.

## 2023-04-25 NOTE — Progress Notes (Signed)
 Chief Complaint:    Dysphagia, GERD, procedure follow-up  GI History: 73 year old female with a history of CKD, HTN, osteopenia, follows in the GI clinic for the following:  1) Dysphagia.  Intermittent pill dysphagia starting 04/2022.Sxs only occur on the right side. Never while eating or drinking fluids.  Can occasionally have symptoms in the middle of the day.  No history of food impaction. Denies any history of reflux sxs. No Heartburn, regurgitation. - 08/23/2022: Evaluated by [facility].  Normal laryngoscopy. - 10/01/2022: Double contrast esophagram: Full column spontaneous gastroesophageal reflux. Moderate esophageal dysmotility with delayed emptying. Widely patent GE junction and no esophageal stricture, mass. 13 mm barium tablet passed into the stomach without delay. - 11/26/2022: Initial appointment in the GI clinic. - 11/30/2022: EGD: Normal esophagus, dilated with 17 mm Savary without mucosal disruption.  Esophageal biopsies negative for EOE.  Normal Z-line.  Moderate striped antral gastritis.  Normal duodenum.  Started on high-dose Protonix  x 4 weeks, then reduced to 40 mg daily due to reflux seen on esophagram and gastritis on EGD.   Endoscopic History: - 12/01/2007: Colonoscopy: Normal - 12/07/2021: Colonoscopy: 2 subcentimeter sessile serrated polyps in the ascending colon, 6 mm sigmoid sessile serrated polyp, sigmoid diverticulosis with tortuosity.  Normal TI.  Recommended repeat in 3 years - 11/30/2022: EGD: Normal esophagus, empiric dilation with Savary.  Biopsies negative for EOE.  Moderate antral gastritis.  HPI:     Patient is a 73 y.o. female presenting to the Gastroenterology Clinic for follow-up.   She states she is feeling much better after EGD with empiric dilation in August and starting pantoprazole .  She is currently taking 40 mg daily.  No heartburn or regurgitation and now no issues tolerating pills.  No solid/liquid dysphagia.  Interestingly, now reports that  she was having intermittent dyspepsia and raspy voice, which has resolved since starting pantoprazole .  No new imaging or labs since last office appointment.   Review of systems:     No chest pain, no SOB, no fevers, no urinary sx   Past Medical History:  Diagnosis Date   Blood transfusion without reported diagnosis    Cataract    Chronic kidney disease    Cystocele    Heart murmur    Hypertension    Osteopenia    Rectocele    SUI (stress urinary incontinence, female)    Uterine prolapse     Patient's surgical history, family medical history, social history, medications and allergies were all reviewed in Epic    Current Outpatient Medications  Medication Sig Dispense Refill   amitriptyline  (ELAVIL ) 50 MG tablet Take 1 tablet (50 mg total) by mouth at bedtime. 90 tablet 1   losartan (COZAAR) 50 MG tablet Take 50 mg by mouth daily.     metoprolol  succinate (TOPROL -XL) 50 MG 24 hr tablet TAKE 1 TABLET BY MOUTH DAILY WITH OR IMMEDIATELY FOLLOWING A MEAL 90 tablet 1   pantoprazole  (PROTONIX ) 40 MG tablet Take 1 tablet (40 mg total) by mouth daily. 30 tablet 2   valACYclovir  (VALTREX ) 1000 MG tablet Take 2 tablets (2,000 mg total) by mouth every 12 (twelve) hours as needed (for outbreaks). Take 2 pills x 1 as needed for outbreaks then 2 pills 12 hours later, MDD 4 40 tablet 5   No current facility-administered medications for this visit.    Physical Exam:     There were no vitals taken for this visit.  GENERAL:  Pleasant female in NAD PSYCH: : Cooperative,  normal affect NEURO: Alert and oriented x 3, no focal neurologic deficits   IMPRESSION and PLAN:    1) GERD 2) Globus sensation 3) Dyspepsia 4) Pill dysphagia 73 year old female with what now is more consistent with GERD, largely with atypical symptoms (globus sensation, dyspepsia, raspy voice).  Symptoms have resolved since EGD with empiric dilation and trial of high-dose PPI therapy.  She has since titrated Protonix   to 40 mg daily, and still with good control of symptoms.  Discussed pathophysiology of reflux at length today, along with typical vs atypical symptomatology with plan for the following:  - Provided with new Rx for Protonix  40 mg daily - Continue Protonix  40 mg/day for the next 3 months.  If symptoms still well-controlled, plan to reduce to 20 mg/day.  If symptoms still controlled at low-dose, can potentially move to on-demand only - Continue antireflux lifestyle/dietary modifications - To call me if symptoms recur  5) History of colon polyps - Repeat colonoscopy in 11/2024 for ongoing polyp surveillance  RTC in 1 year or sooner as needed.      Sandor LULLA Flatter ,DO, FACG 04/25/2023, 9:21 AM

## 2023-05-06 ENCOUNTER — Ambulatory Visit: Payer: Self-pay | Admitting: Internal Medicine

## 2023-05-06 NOTE — Telephone Encounter (Addendum)
  Chief Complaint: feels like viral URI  back in December Symptoms: cough Frequency: x 1 week Pertinent Negatives: Patient denies fever, body aches, chills, chest pain, SOB, runny nose, wheezing, nausea, vomiting Disposition: [] ED /[] Urgent Care (no appt availability in office) / [] Appointment(In office/virtual)/ []  [facility]/ [] Home Care/ [] Refused Recommended Disposition /[] [place]/ [x]  Follow-up with PCP Additional Notes: Patient states she has tried OTC Muccinex and does not think that has helped. She states the cough is worse in the morning when she first wakes up. Patient states back in December when she was seen Dr Geofm told her she could send in cough syrup if her husband also had symptoms. She states her husband had similar symptoms x 2 weeks. Patient requesting cough syrup medication.   Copied from CRM [id number]. Topic: Clinical - Medication Refill >> May 03, 2023  9:02 AM Viola FALCON wrote: Most Recent Primary Care Visit:  Provider: SHELTON TRINNA CROME  Department: [facility]  Visit Type: MEDICARE AWV, SEQUENTIAL  Date: 03/29/2023  Medication: Patient request cough syrup for cough  Has the patient contacted their pharmacy? No (Agent: If no, request that the patient contact the pharmacy for the refill. If patient does not wish to contact the pharmacy document the reason why and proceed with request.) (Agent: If yes, when and what did the pharmacy advise?)  Is this the correct pharmacy for this prescription? Yes If no, delete pharmacy and type the correct one.  This is the patient's preferred pharmacy:   [facility] [id number] [place], [place] - [address] [place] [address] Phone: [phone] Fax: [phone]     Has the prescription been filled recently? No  Is the patient out of the medication? Yes  Has the patient been seen for an appointment in the last year OR does the patient have an upcoming  appointment? Yes  Can we respond through MyChart? No  Agent: Please be advised that Rx refills may take up to 3 business days. We ask that you follow-up with your pharmacy.

## 2023-05-06 NOTE — Telephone Encounter (Signed)
 Reason for Disposition . Cough  Answer Assessment - Initial Assessment Questions 1. ONSET: When did the cough begin?      X 1 week.  2. SEVERITY: How bad is the cough today?      Patient states she can go for hours without the cough and then will have a spell that sounds horrible.  3. SPUTUM: Describe the color of your sputum (none, dry cough; clear, white, yellow, green)     Sometimes it is green.  4. HEMOPTYSIS: Are you coughing up any blood? If so ask: How much? (flecks, streaks, tablespoons, etc.)     Denies.  5. DIFFICULTY BREATHING: Are you having difficulty breathing? If Yes, ask: How bad is it? (e.g., mild, moderate, severe)    - MILD: No SOB at rest, mild SOB with walking, speaks normally in sentences, can lie down, no retractions, pulse < 100.    - MODERATE: SOB at rest, SOB with minimal exertion and prefers to sit, cannot lie down flat, speaks in phrases, mild retractions, audible wheezing, pulse 100-120.    - SEVERE: Very SOB at rest, speaks in single words, struggling to breathe, sitting hunched forward, retractions, pulse > 120      Denies.  6. FEVER: Do you have a fever? If Yes, ask: What is your temperature, how was it measured, and when did it start?     Denies.  7. CARDIAC HISTORY: Do you have any history of heart disease? (e.g., heart attack, congestive heart failure)      Denies.  8. LUNG HISTORY: Do you have any history of lung disease?  (e.g., pulmonary embolus, asthma, emphysema)     Denies.  9. PE RISK FACTORS: Do you have a history of blood clots? (or: recent major surgery, recent prolonged travel, bedridden)     Denies.  10. OTHER SYMPTOMS: Do you have any other symptoms? (e.g., runny nose, wheezing, chest pain)       Hoarse voice laryngitis for first few days and has resolved after 2 days.  11. TRAVEL: Have you traveled out of the country in the last month? (e.g., travel history, exposures)       She states her husband has  the same symptoms 1 week prior to her symptoms.  Protocols used: Cough - Acute Productive-A-AH

## 2023-05-07 MED ORDER — HYDROCODONE BIT-HOMATROP MBR 5-1.5 MG/5ML PO SOLN: 5.0000 mL | Freq: Three times a day (TID) | ORAL | 0 refills | Status: AC | PRN

## 2023-05-07 NOTE — Addendum Note (Signed)
 Addended by: Pincus Sanes on: 05/07/2023 12:10 PM   Modules accepted: Orders

## 2023-05-07 NOTE — Telephone Encounter (Signed)
 Attempted to reach patient but VM is full and unable to leave message.  If she calls back please let her know cough syrup was sent to pharmacy and that it may make her drowsy.  If she is not better she needs to follow up in office.

## 2023-05-07 NOTE — Telephone Encounter (Signed)
 Cough syrup sent to [facility].  It will make her a little drowsy so best to use at night.  If her symptoms are not improving or worsening she should come in for evaluation.

## 2023-05-15 DIAGNOSIS — N1832 Chronic kidney disease, stage 3b: Secondary | ICD-10-CM | POA: Diagnosis not present

## 2023-05-15 LAB — LAB REPORT - SCANNED: EGFR: 33

## 2023-05-20 DIAGNOSIS — E559 Vitamin D deficiency, unspecified: Secondary | ICD-10-CM | POA: Diagnosis not present

## 2023-05-20 DIAGNOSIS — N1832 Chronic kidney disease, stage 3b: Secondary | ICD-10-CM | POA: Diagnosis not present

## 2023-05-20 DIAGNOSIS — R7303 Prediabetes: Secondary | ICD-10-CM | POA: Diagnosis not present

## 2023-05-20 DIAGNOSIS — I129 Hypertensive chronic kidney disease with stage 1 through stage 4 chronic kidney disease, or unspecified chronic kidney disease: Secondary | ICD-10-CM | POA: Diagnosis not present

## 2023-05-21 NOTE — Patient Instructions (Addendum)

## 2023-05-21 NOTE — Progress Notes (Unsigned)
Subjective:    Patient ID: Danielle Fischer, female    DOB: 07-16-1950, 73 y.o.   MRN: [id number]      HPI Danielle Fischer is here for a Physical exam and her chronic medical problems.    Overall doing well without concerns.  She saw Dr. Malen Fischer recently and had blood work done.   Medications and allergies reviewed with patient and updated if appropriate.  Current Outpatient Medications on File Prior to Visit  Medication Sig Dispense Refill   amitriptyline (ELAVIL) 50 MG tablet Take 1 tablet (50 mg total) by mouth at bedtime. 90 tablet 1   losartan (COZAAR) 50 MG tablet Take 25 mg by mouth daily.     metoprolol succinate (TOPROL-XL) 50 MG 24 hr tablet TAKE 1 TABLET BY MOUTH DAILY WITH OR IMMEDIATELY FOLLOWING A MEAL 90 tablet 1   pantoprazole (PROTONIX) 40 MG tablet Take 1 tablet (40 mg total) by mouth daily. 30 tablet 2   valACYclovir (VALTREX) 1000 MG tablet Take 2 tablets (2,000 mg total) by mouth every 12 (twelve) hours as needed (for outbreaks). Take 2 pills x 1 as needed for outbreaks then 2 pills 12 hours later, MDD 4 40 tablet 5   No current facility-administered medications on file prior to visit.    Review of Systems  Constitutional:  Negative for fever.  HENT:  Negative for postnasal drip and trouble swallowing.   Eyes:  Negative for visual disturbance.  Respiratory:  Negative for cough, shortness of breath and wheezing.   Cardiovascular:  Negative for chest pain, palpitations and leg swelling.  Gastrointestinal:  Negative for abdominal pain, blood in stool, constipation and diarrhea.       No gerd  Genitourinary:  Negative for dysuria.  Musculoskeletal:  Positive for back pain (rarely). Negative for arthralgias.  Skin:  Negative for rash.  Neurological:  Positive for light-headedness (occ with pots - rare for a while). Negative for headaches.  Psychiatric/Behavioral:  Negative for dysphoric mood. The patient is not nervous/anxious.        Objective:   Vitals:    05/22/23 1425  BP: 132/72  Pulse: 87  Temp: 98 F (36.7 C)  SpO2: 96%   Filed Weights   05/22/23 1425  Weight: 152 lb (68.9 kg)   Body mass index is 26.09 kg/m.  BP Readings from Last 3 Encounters:  05/22/23 132/72  04/25/23 (!) 140/70  03/26/23 124/80    Wt Readings from Last 3 Encounters:  05/22/23 152 lb (68.9 kg)  04/25/23 151 lb (68.5 kg)  03/29/23 149 lb (67.6 kg)       Physical Exam Constitutional: She appears well-developed and well-nourished. No distress.  HENT:  Head: Normocephalic and atraumatic.  Right Ear: External ear normal. Normal ear canal and TM Left Ear: External ear normal.  Normal ear canal and TM Mouth/Throat: Oropharynx is clear and moist.  Eyes: Conjunctivae normal.  Neck: Neck supple. No tracheal deviation present. No thyromegaly present.  No carotid bruit  Cardiovascular: Normal rate, regular rhythm and normal heart sounds.   No murmur heard.  No edema. Pulmonary/Chest: Effort normal and breath sounds normal. No respiratory distress. She has no wheezes. She has no rales.  Breast: deferred   Abdominal: Soft. She exhibits no distension. There is no tenderness.  Lymphadenopathy: She has no cervical adenopathy.  Skin: Skin is warm and dry. She is not diaphoretic.  Psychiatric: She has a normal mood and affect. Her behavior is normal.     Lab Results  Component Value Date   WBC 5.0 08/30/2022   HGB 12.7 08/30/2022   HCT 37.5 08/30/2022   PLT 238.0 08/30/2022   GLUCOSE 76 08/30/2022   CHOL 210 (H) 08/30/2022   TRIG 175.0 (H) 08/30/2022   HDL 68.30 08/30/2022   LDLDIRECT 115.4 04/25/2011   LDLCALC 107 (H) 08/30/2022   ALT 11 08/30/2022   AST 18 08/30/2022   NA 141 08/30/2022   K 4.6 08/30/2022   CL 105 08/30/2022   CREATININE 1.75 (H) 08/30/2022   BUN 24 (H) 08/30/2022   CO2 29 08/30/2022   TSH 2.26 08/30/2022   HGBA1C 5.5 08/30/2022    Reviewed blood work from Dr. Mercy Fischer office  The 10-year ASCVD risk score (Arnett DK,  et al., 2019) is: 16.2%   Values used to calculate the score:     Age: 48 years     Sex: Female     Is Non-Hispanic African American: No     Diabetic: No     Tobacco smoker: No     Systolic Blood Pressure: 132 mmHg     Is BP treated: Yes     HDL Cholesterol: 68.3 mg/dL     Total Cholesterol: 210 mg/dL      Assessment & Plan:   Physical exam: Screening blood work deferred-just had blood work done will be nephrology-reviewed.  We will hold off on checking the things not checked for now Exercise  not regular  - - will go back to it Weight  is good Substance abuse  none   Reviewed recommended immunizations.   Health Maintenance  Topic Date Due   DTaP/Tdap/Td (2 - Tdap) 05/24/2023 (Originally 04/23/2012)   COVID-19 Vaccine (4 - 2024-25 season) 06/07/2023 (Originally 12/23/2022)   INFLUENZA VACCINE  07/22/2023 (Originally 11/22/2022)   Medicare Annual Wellness (AWV)  03/28/2024   DEXA SCAN  03/31/2024   MAMMOGRAM  08/30/2024   Colonoscopy  12/07/2024   Pneumonia Vaccine 85+ Years old  Completed   Hepatitis C Screening  Completed   Zoster Vaccines- Shingrix  Completed   HPV VACCINES  Aged Out          See Problem List for Assessment and Plan of chronic medical problems.

## 2023-05-22 ENCOUNTER — Encounter: Payer: Self-pay | Admitting: Internal Medicine

## 2023-05-22 ENCOUNTER — Ambulatory Visit (INDEPENDENT_AMBULATORY_CARE_PROVIDER_SITE_OTHER): Payer: PPO | Admitting: Internal Medicine

## 2023-05-22 VITALS — BP 132/72 | HR 87 | Temp 98.0°F | Ht 64.0 in | Wt 152.0 lb

## 2023-05-22 DIAGNOSIS — N1832 Chronic kidney disease, stage 3b: Secondary | ICD-10-CM

## 2023-05-22 DIAGNOSIS — B009 Herpesviral infection, unspecified: Secondary | ICD-10-CM

## 2023-05-22 DIAGNOSIS — E559 Vitamin D deficiency, unspecified: Secondary | ICD-10-CM | POA: Diagnosis not present

## 2023-05-22 DIAGNOSIS — G90A Postural orthostatic tachycardia syndrome (POTS): Secondary | ICD-10-CM

## 2023-05-22 DIAGNOSIS — Z0000 Encounter for general adult medical examination without abnormal findings: Secondary | ICD-10-CM | POA: Diagnosis not present

## 2023-05-22 DIAGNOSIS — G8929 Other chronic pain: Secondary | ICD-10-CM

## 2023-05-22 DIAGNOSIS — M8589 Other specified disorders of bone density and structure, multiple sites: Secondary | ICD-10-CM | POA: Diagnosis not present

## 2023-05-22 DIAGNOSIS — K2960 Other gastritis without bleeding: Secondary | ICD-10-CM

## 2023-05-22 DIAGNOSIS — E782 Mixed hyperlipidemia: Secondary | ICD-10-CM

## 2023-05-22 DIAGNOSIS — R7303 Prediabetes: Secondary | ICD-10-CM | POA: Diagnosis not present

## 2023-05-22 DIAGNOSIS — I10 Essential (primary) hypertension: Secondary | ICD-10-CM | POA: Diagnosis not present

## 2023-05-22 DIAGNOSIS — R1310 Dysphagia, unspecified: Secondary | ICD-10-CM

## 2023-05-22 DIAGNOSIS — K2970 Gastritis, unspecified, without bleeding: Secondary | ICD-10-CM | POA: Insufficient documentation

## 2023-05-22 MED ORDER — METOPROLOL SUCCINATE ER 50 MG PO TB24: ORAL_TABLET | ORAL | 3 refills | Status: AC

## 2023-05-22 NOTE — Assessment & Plan Note (Signed)
Chronic Controlled Continue amitriptyline 50 mg nightly

## 2023-05-22 NOTE — Assessment & Plan Note (Signed)
Recent EGD that showed gastritis and she did have esophageal dilation Did have some epigastric discomfort or upset stomach on occasion, also had pain in her throat when she swallowed the pills No GERD per se Likely had silent GERD On pantoprazole 40 mg daily-continue daily and eventually could try going to every other day and weaning down to 20 mg a day and then contacting GI for further advice Would like to get her off the PPI because of her CKD, but concerned because of her silent GERD that she never knew she had

## 2023-05-22 NOTE — Assessment & Plan Note (Addendum)
Chronic Continue vitamin D supplementation

## 2023-05-22 NOTE — Assessment & Plan Note (Addendum)
Chronic Done at [facility]-up-to-date Continue regular exercise Continue calcium and vitamin D

## 2023-05-22 NOTE — Assessment & Plan Note (Addendum)
Chronic Following with [facility] - Dr Malen Gauze Renal function slightly progressive Stressed keeping BP and sugars well controlled to prevent further kidney damage Does not take any NSAIDs Encouraged good water intake Reviewed recent blood work from Dr. Malen Gauze

## 2023-05-22 NOTE — Assessment & Plan Note (Addendum)
Chronic BP well controlled CMP, CBC Continue metoprolol xl 50 mg daily, losartan 50 mg daily  EKG: NSR at 83 bpm, nonspecific ST abnormality, otherwise normal.  No change compared to EKG from 08/2017

## 2023-05-22 NOTE — Assessment & Plan Note (Addendum)
Chronic Lab Results  Component Value Date   HGBA1C 5.5 08/30/2022    Low sugar / carb diet Stressed regular exercise

## 2023-05-22 NOTE — Assessment & Plan Note (Signed)
Resolved Was related to GERD/esophageal stricture Had silent GERD-currently on pantoprazole

## 2023-05-22 NOTE — Assessment & Plan Note (Addendum)
Chronic Recently had some increased symptoms which may have been related to variable blood pressure Blood pressure medication adjusted by nephrology and we increased her in which during Feels better Doing well with amitriptyline 50 mg nightly-continue Stays well-hydrated Compression socks Restart regular exercise

## 2023-05-22 NOTE — Assessment & Plan Note (Addendum)
Chronic Regular exercise and healthy diet encouraged Just had blood work done so we will hold off on checking her lipid panel-reasonably controlled last spring Continue lifestyle control Discussed CT CAC

## 2023-05-22 NOTE — Assessment & Plan Note (Signed)
Chronic Intermittent Continue Valtrex as needed

## 2023-06-04 ENCOUNTER — Other Ambulatory Visit: Payer: Self-pay | Admitting: Gastroenterology

## 2023-06-04 DIAGNOSIS — K2970 Gastritis, unspecified, without bleeding: Secondary | ICD-10-CM

## 2023-07-13 ENCOUNTER — Encounter: Payer: Self-pay | Admitting: Internal Medicine

## 2023-08-05 ENCOUNTER — Other Ambulatory Visit: Payer: Self-pay | Admitting: Internal Medicine

## 2023-08-08 ENCOUNTER — Telehealth: Payer: Self-pay | Admitting: Internal Medicine

## 2023-08-08 DIAGNOSIS — R0600 Dyspnea, unspecified: Secondary | ICD-10-CM

## 2023-08-08 NOTE — Telephone Encounter (Signed)
 Copied from CRM [id number]. Topic: Referral - Question >> Aug 08, 2023  2:43 PM Freya Jesus wrote: Reason for CRM: Patient stated that provider placed a referral for her to get an echocardiogram at her last appointment. Patient is requesting the name and number so she can call and schedule an appointment. (Reviewed chart did not see any referrals.)

## 2023-08-09 NOTE — Telephone Encounter (Signed)
 Is she talking about the CT coronary artery calcium test - a ct scan to look at the amount of cholesterol or plaque build up in the heart arteries - we had discovered that -- I can order if she wishes.

## 2023-08-12 NOTE — Telephone Encounter (Signed)
 Echocardiogram ordered-lets start with that and then we can decide if we need to do further testing

## 2023-08-13 NOTE — Telephone Encounter (Signed)
Spoke with patient and info given 

## 2023-09-10 DIAGNOSIS — N1832 Chronic kidney disease, stage 3b: Secondary | ICD-10-CM | POA: Diagnosis not present

## 2023-09-18 DIAGNOSIS — R7303 Prediabetes: Secondary | ICD-10-CM | POA: Diagnosis not present

## 2023-09-18 DIAGNOSIS — E559 Vitamin D deficiency, unspecified: Secondary | ICD-10-CM | POA: Diagnosis not present

## 2023-09-18 DIAGNOSIS — Z1231 Encounter for screening mammogram for malignant neoplasm of breast: Secondary | ICD-10-CM | POA: Diagnosis not present

## 2023-09-18 DIAGNOSIS — I129 Hypertensive chronic kidney disease with stage 1 through stage 4 chronic kidney disease, or unspecified chronic kidney disease: Secondary | ICD-10-CM | POA: Diagnosis not present

## 2023-09-18 DIAGNOSIS — N1832 Chronic kidney disease, stage 3b: Secondary | ICD-10-CM | POA: Diagnosis not present

## 2023-09-18 LAB — HM MAMMOGRAPHY

## 2023-09-20 ENCOUNTER — Encounter: Payer: Self-pay | Admitting: Internal Medicine

## 2023-12-11 ENCOUNTER — Telehealth: Payer: Self-pay

## 2023-12-11 ENCOUNTER — Ambulatory Visit (HOSPITAL_COMMUNITY)
Admission: RE | Admit: 2023-12-11 | Discharge: 2023-12-11 | Disposition: A | Discharge status: Home / Self Care | Source: Ambulatory Visit | Attending: Cardiovascular Disease | Admitting: Cardiovascular Disease

## 2023-12-11 DIAGNOSIS — R0600 Dyspnea, unspecified: Secondary | ICD-10-CM | POA: Insufficient documentation

## 2023-12-11 LAB — ECHOCARDIOGRAM COMPLETE
Area-P 1/2: 3.5 cm2
S' Lateral: 2.21 cm

## 2023-12-11 NOTE — Telephone Encounter (Signed)
 Copied from CRM [id number]. Topic: General - Other >> Dec 11, 2023  8:54 AM Franky GRADE wrote: Reason for CRM: Patient is currently at [facility] at [facility] for an Echo that was ordered by Dr.Burns and she was advised that they had not received the prior auth from the office.

## 2023-12-11 NOTE — Telephone Encounter (Signed)
 Copied from CRM [id number]. Topic: Clinical - Request for Lab/Test Order >> Dec 11, 2023  9:10 AM Danielle Fischer wrote: Reason for CRM:  Provider from Cardiology Dept called to speak regarding a Delay of Care - (pre-auth needed) He requesting MD Burns to send/complete pre-authorization request for the Echocardiogram PT is having this morning.   Spoke to [place] from the CAL - but wasn't provided with a person for the Cardiologist to speak to until over 6 mins on hold - causing a delay in care and a delay in relaying information to the clinic regarding needed orders for PT procedure.   Please Advise and get back to the needed Clinical Staff ASAP

## 2023-12-12 ENCOUNTER — Ambulatory Visit: Payer: Self-pay | Admitting: Internal Medicine

## 2023-12-12 DIAGNOSIS — I351 Nonrheumatic aortic (valve) insufficiency: Secondary | ICD-10-CM | POA: Insufficient documentation

## 2023-12-25 DIAGNOSIS — N1832 Chronic kidney disease, stage 3b: Secondary | ICD-10-CM | POA: Diagnosis not present

## 2024-01-01 DIAGNOSIS — E559 Vitamin D deficiency, unspecified: Secondary | ICD-10-CM | POA: Diagnosis not present

## 2024-01-01 DIAGNOSIS — N1832 Chronic kidney disease, stage 3b: Secondary | ICD-10-CM | POA: Diagnosis not present

## 2024-01-01 DIAGNOSIS — I129 Hypertensive chronic kidney disease with stage 1 through stage 4 chronic kidney disease, or unspecified chronic kidney disease: Secondary | ICD-10-CM | POA: Diagnosis not present

## 2024-01-01 DIAGNOSIS — R7303 Prediabetes: Secondary | ICD-10-CM | POA: Diagnosis not present

## 2024-02-02 ENCOUNTER — Other Ambulatory Visit: Payer: Self-pay | Admitting: Internal Medicine

## 2024-03-02 ENCOUNTER — Ambulatory Visit (HOSPITAL_COMMUNITY)
Admission: RE | Admit: 2024-03-02 | Discharge: 2024-03-02 | Disposition: A | Discharge status: Home / Self Care | Source: Ambulatory Visit | Attending: Surgery | Admitting: Surgery

## 2024-03-02 ENCOUNTER — Other Ambulatory Visit: Payer: Self-pay

## 2024-03-02 ENCOUNTER — Other Ambulatory Visit (HOSPITAL_COMMUNITY): Payer: Self-pay | Admitting: Nephrology

## 2024-03-02 DIAGNOSIS — I10 Essential (primary) hypertension: Secondary | ICD-10-CM | POA: Diagnosis not present

## 2024-03-02 DIAGNOSIS — H2513 Age-related nuclear cataract, bilateral: Secondary | ICD-10-CM | POA: Diagnosis not present

## 2024-03-02 DIAGNOSIS — H43391 Other vitreous opacities, right eye: Secondary | ICD-10-CM | POA: Diagnosis not present

## 2024-03-02 DIAGNOSIS — H43812 Vitreous degeneration, left eye: Secondary | ICD-10-CM | POA: Diagnosis not present

## 2024-03-25 DIAGNOSIS — N1832 Chronic kidney disease, stage 3b: Secondary | ICD-10-CM | POA: Diagnosis not present

## 2024-04-01 DIAGNOSIS — I129 Hypertensive chronic kidney disease with stage 1 through stage 4 chronic kidney disease, or unspecified chronic kidney disease: Secondary | ICD-10-CM | POA: Diagnosis not present

## 2024-04-01 DIAGNOSIS — R7303 Prediabetes: Secondary | ICD-10-CM | POA: Diagnosis not present

## 2024-04-01 DIAGNOSIS — N184 Chronic kidney disease, stage 4 (severe): Secondary | ICD-10-CM | POA: Diagnosis not present

## 2024-04-01 DIAGNOSIS — E559 Vitamin D deficiency, unspecified: Secondary | ICD-10-CM | POA: Diagnosis not present

## 2024-04-22 ENCOUNTER — Ambulatory Visit

## 2024-05-04 ENCOUNTER — Encounter: Payer: Self-pay | Admitting: Gastroenterology

## 2024-05-04 ENCOUNTER — Ambulatory Visit: Admitting: Gastroenterology

## 2024-05-04 VITALS — BP 124/72 | HR 116 | Ht 64.0 in | Wt 145.0 lb

## 2024-05-04 DIAGNOSIS — K2970 Gastritis, unspecified, without bleeding: Secondary | ICD-10-CM

## 2024-05-04 DIAGNOSIS — Z860100 Personal history of colon polyps, unspecified: Secondary | ICD-10-CM

## 2024-05-04 DIAGNOSIS — K219 Gastro-esophageal reflux disease without esophagitis: Secondary | ICD-10-CM | POA: Diagnosis not present

## 2024-05-04 DIAGNOSIS — N1832 Chronic kidney disease, stage 3b: Secondary | ICD-10-CM

## 2024-05-04 MED ORDER — PANTOPRAZOLE SODIUM 40 MG PO TBEC: 40.0000 mg | DELAYED_RELEASE_TABLET | Freq: Every day | ORAL | 3 refills | Status: AC

## 2024-05-04 NOTE — Patient Instructions (Addendum)
 _______________________________________________________  If your blood pressure at your visit was 140/90 or greater, please contact your primary care physician to follow up on this.  _______________________________________________________  If you are age 74 or older, your body mass index should be between 23-30. Your Body mass index is 24.89 kg/m. If this is out of the aforementioned range listed, please consider follow up with your Primary Care Provider.  If you are age 75 or younger, your body mass index should be between 19-25. Your Body mass index is 24.89 kg/m. If this is out of the aformentioned range listed, please consider follow up with your Primary Care Provider.   ________________________________________________________  The [facility] providers would like to encourage you to use MYCHART to communicate with providers for non-urgent requests or questions.  Due to long hold times on the telephone, sending your provider a message by [facility] may be a faster and more efficient way to get a response.  Please allow 48 business hours for a response.  Please remember that this is for non-urgent requests.  _______________________________________________________  [facility] is using a team-based approach to care.  Your team is made up of your doctor and two to three APPS. Our APPS (Nurse Practitioners and Physician Assistants) work with your physician to ensure care continuity for you. They are fully qualified to address your health concerns and develop a treatment plan. They communicate directly with your gastroenterologist to care for you. Seeing the Advanced Practice Practitioners on your physician's team can help you by facilitating care more promptly, often allowing for earlier appointments, access to diagnostic testing, procedures, and other specialty referrals.   We have sent the following medications to your pharmacy for you to pick up at your convenience:  Protonix  40mg   one tablet daily  You will need a colonoscopy in August 2026.  We will contact you to schedule this appointment.  It was a pleasure to see you today!  Vito Cirigliano, D.O.

## 2024-05-04 NOTE — Progress Notes (Signed)
 "  Chief Complaint:    GERD, medication refill  GI History: 74 year old female with a history of CKD, HTN, osteopenia, follows in the GI clinic for the following:   1) Dysphagia.  Intermittent pill dysphagia starting 04/2022.Sxs only occur on the right side. Never while eating or drinking fluids.  Can occasionally have symptoms in the middle of the day.  No history of food impaction. Denies any history of reflux sxs. No Heartburn, regurgitation. - 08/23/2022: Evaluated by [facility].  Normal laryngoscopy. - 10/01/2022: Double contrast esophagram: Full column spontaneous gastroesophageal reflux. Moderate esophageal dysmotility with delayed emptying. Widely patent GE junction and no esophageal stricture, mass. 13 mm barium tablet passed into the stomach without delay. - 11/26/2022: Initial appointment in the GI clinic. - 11/30/2022: EGD: Normal esophagus, dilated with 17 mm Savary without mucosal disruption.  Esophageal biopsies negative for EOE.  Normal Z-line.  Moderate striped antral gastritis.  Normal duodenum.  Started on high-dose Protonix  x 4 weeks, then reduced to 40 mg daily due to reflux seen on esophagram and gastritis on EGD.  2) GERD.  Looking back, index symptoms were likely intermittent raspy voice and dyspepsia, but otherwise does not have typical reflux symptoms.  Full, spontaneous reflux noted on esophagram in 09/2022 as above.  Possible that her dysphagia was from reflux burnout and has had excellent response to acid suppression therapy.  Raspy voice and dyspepsia resolved with PPI as well.     Endoscopic History: - 12/01/2007: Colonoscopy: Normal - 12/07/2021: Colonoscopy: 2 subcentimeter sessile serrated polyps in the ascending colon, 6 mm sigmoid sessile serrated polyp, sigmoid diverticulosis with tortuosity.  Normal TI.  Recommended repeat in 3 years - 11/30/2022: EGD: Normal esophagus, empiric dilation with Savary.  Biopsies negative for EOE.  Moderate antral gastritis.  HPI:      Patient is a 74 y.o. female presenting to the Gastroenterology Clinic for follow-up.  No active issues or symptoms today.  Does need refill placed for Protonix .  No recurrence of dysphagia.    Review of systems:     No chest pain, no SOB, no fevers, no urinary sx   Past Medical History:  Diagnosis Date   Blood transfusion without reported diagnosis    Cataract    Chronic kidney disease    Cystocele    Heart murmur    Hypertension    Osteopenia    Rectocele    SUI (stress urinary incontinence, female)    Uterine prolapse     Patient's surgical history, family medical history, social history, medications and allergies were all reviewed in Epic    Current Outpatient Medications  Medication Sig Dispense Refill   amitriptyline  (ELAVIL ) 50 MG tablet TAKE 1 TABLET BY MOUTH AT BEDTIME 90 tablet 0   metoprolol  succinate (TOPROL -XL) 50 MG 24 hr tablet TAKE 1 TABLET BY MOUTH DAILY WITH OR IMMEDIATELY FOLLOWING A MEAL 90 tablet 3   pantoprazole  (PROTONIX ) 40 MG tablet TAKE 1 TABLET BY MOUTH DAILY 90 tablet 3   valACYclovir  (VALTREX ) 1000 MG tablet Take 2 tablets (2,000 mg total) by mouth every 12 (twelve) hours as needed (for outbreaks). Take 2 pills x 1 as needed for outbreaks then 2 pills 12 hours later, MDD 4 40 tablet 5   No current facility-administered medications for this visit.    Physical Exam:     BP 124/72   Pulse (!) 116   Ht 5' 4 (1.626 m)   Wt 145 lb (65.8 kg)   BMI 24.89  kg/m   GENERAL:  Pleasant female in NAD PSYCH: : Cooperative, normal affect Musculoskeletal:  Normal muscle tone, normal strength NEURO: Alert and oriented x 3, no focal neurologic deficits   IMPRESSION and PLAN:    38) GERD 74 year old female with what now is more consistent with GERD, largely with atypical symptoms (globus sensation, dyspepsia, raspy voice). Symptoms have resolved since EGD with empiric dilation and trial of high-dose PPI therapy, and titrated to daily PPI without  recurrence of index symptoms. - Continue Protonix  40 mg daily - Provided Rx for Protonix  refill today - If no return of symptoms, can consider reducing to 20 mg daily to get to lowest effective dose, particularly with history of CKD  2) History of colon polyps - Repeat colonoscopy in 11/2024 for ongoing polyp surveillance - Interestingly, patient had sore throat develop following colonoscopy previously.  Symptoms eventually resolved, but unclear etiology.  Discussed that today.  3) Gastritis 4) Dyspepsia - Symptoms resolved with PPI  5) CKD - Follows in the Nephrology Clinic.  UTD on labs  Can otherwise follow-up in the GI clinic in 1 year or sooner as needed  The indications, risks, and benefits of colonoscopy were explained to the patient in detail. Risks include but are not limited to bleeding, perforation, adverse reaction to medications, and cardiopulmonary compromise. Sequelae include but are not limited to the possibility of surgery, hospitalization, and mortality. The patient verbalized understanding and wished to proceed. All questions answered, referred to the scheduler and bowel prep ordered. Further recommendations pending results of the exam.             Sandor LULLA Flatter ,DO, FACG 05/04/2024, 10:53 AM  "

## 2024-05-08 ENCOUNTER — Other Ambulatory Visit: Payer: Self-pay | Admitting: Internal Medicine

## 2024-06-12 ENCOUNTER — Encounter: Admitting: Internal Medicine

## 2024-06-12 ENCOUNTER — Ambulatory Visit
